# Patient Record
Sex: Female | Born: 1990 | Race: Black or African American | Hispanic: No | Marital: Single | Smoking: Never smoker
Health system: Southern US, Community
[De-identification: ages and names within clinical notes are randomized; demographics above are authoritative.]

## PROBLEM LIST (undated history)

## (undated) ENCOUNTER — Inpatient Hospital Stay (HOSPITAL_COMMUNITY): Payer: Self-pay

## (undated) DIAGNOSIS — A749 Chlamydial infection, unspecified: Secondary | ICD-10-CM

## (undated) DIAGNOSIS — Z789 Other specified health status: Secondary | ICD-10-CM

## (undated) DIAGNOSIS — I10 Essential (primary) hypertension: Secondary | ICD-10-CM

## (undated) DIAGNOSIS — N390 Urinary tract infection, site not specified: Secondary | ICD-10-CM

## (undated) DIAGNOSIS — K8020 Calculus of gallbladder without cholecystitis without obstruction: Secondary | ICD-10-CM

## (undated) DIAGNOSIS — O2340 Unspecified infection of urinary tract in pregnancy, unspecified trimester: Secondary | ICD-10-CM

## (undated) HISTORY — PX: INDUCED ABORTION: SHX677

## (undated) HISTORY — PX: EYE SURGERY: SHX253

## (undated) HISTORY — DX: Essential (primary) hypertension: I10

---
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
## 2009-03-29 ENCOUNTER — Emergency Department (HOSPITAL_COMMUNITY): Admission: EM | Admit: 2009-03-29 | Discharge: 2009-03-29 | Payer: Self-pay | Admitting: Emergency Medicine

## 2009-08-10 ENCOUNTER — Emergency Department (HOSPITAL_COMMUNITY): Admission: EM | Admit: 2009-08-10 | Discharge: 2009-08-10 | Payer: Self-pay | Admitting: Emergency Medicine

## 2009-11-30 ENCOUNTER — Emergency Department (HOSPITAL_COMMUNITY): Admission: EM | Admit: 2009-11-30 | Discharge: 2009-11-30 | Payer: Self-pay | Admitting: Pediatric Emergency Medicine

## 2009-12-03 ENCOUNTER — Emergency Department (HOSPITAL_COMMUNITY): Admission: EM | Admit: 2009-12-03 | Discharge: 2009-12-03 | Payer: Self-pay | Admitting: Emergency Medicine

## 2010-06-23 ENCOUNTER — Emergency Department (HOSPITAL_COMMUNITY)
Admission: EM | Admit: 2010-06-23 | Discharge: 2010-06-23 | Payer: Self-pay | Source: Home / Self Care | Admitting: Emergency Medicine

## 2010-09-17 LAB — URINALYSIS, ROUTINE W REFLEX MICROSCOPIC
Bilirubin Urine: NEGATIVE
Glucose, UA: NEGATIVE mg/dL
Hgb urine dipstick: NEGATIVE
Ketones, ur: NEGATIVE mg/dL
Nitrite: NEGATIVE
Protein, ur: NEGATIVE mg/dL
Specific Gravity, Urine: 1.03 (ref 1.005–1.030)
Urobilinogen, UA: 1 mg/dL (ref 0.0–1.0)
pH: 6 (ref 5.0–8.0)

## 2010-09-17 LAB — POCT I-STAT, CHEM 8
BUN: 14 mg/dL (ref 6–23)
Calcium, Ion: 1.22 mmol/L (ref 1.12–1.32)
Chloride: 106 mEq/L (ref 96–112)
Creatinine, Ser: 0.7 mg/dL (ref 0.4–1.2)
Glucose, Bld: 119 mg/dL — ABNORMAL HIGH (ref 70–99)
HCT: 40 % (ref 36.0–46.0)
Potassium: 3.9 mEq/L (ref 3.5–5.1)

## 2010-09-17 LAB — DIFFERENTIAL
Lymphocytes Relative: 22 % (ref 12–46)
Lymphs Abs: 3.1 10*3/uL (ref 0.7–4.0)
Monocytes Absolute: 1 10*3/uL (ref 0.1–1.0)
Monocytes Relative: 7 % (ref 3–12)
Neutro Abs: 9.8 10*3/uL — ABNORMAL HIGH (ref 1.7–7.7)
Neutrophils Relative %: 70 % (ref 43–77)

## 2010-09-17 LAB — POCT PREGNANCY, URINE: Preg Test, Ur: POSITIVE

## 2010-09-17 LAB — CBC
HCT: 38 % (ref 36.0–46.0)
Hemoglobin: 12.6 g/dL (ref 12.0–15.0)
MCHC: 33.2 g/dL (ref 30.0–36.0)
RBC: 4.37 MIL/uL (ref 3.87–5.11)
WBC: 14.1 10*3/uL — ABNORMAL HIGH (ref 4.0–10.5)

## 2010-09-24 LAB — CULTURE, ROUTINE-ABSCESS

## 2010-09-26 LAB — RAPID STREP SCREEN (MED CTR MEBANE ONLY): Streptococcus, Group A Screen (Direct): NEGATIVE

## 2010-10-11 ENCOUNTER — Inpatient Hospital Stay (HOSPITAL_COMMUNITY)
Admission: AD | Admit: 2010-10-11 | Discharge: 2010-10-11 | Disposition: A | Discharge status: Home / Self Care | Payer: Self-pay | Source: Ambulatory Visit | Attending: Obstetrics & Gynecology | Admitting: Obstetrics & Gynecology

## 2010-10-11 DIAGNOSIS — R350 Frequency of micturition: Secondary | ICD-10-CM | POA: Insufficient documentation

## 2010-10-11 DIAGNOSIS — N390 Urinary tract infection, site not specified: Secondary | ICD-10-CM | POA: Insufficient documentation

## 2010-10-11 DIAGNOSIS — O2390 Unspecified genitourinary tract infection in pregnancy, unspecified trimester: Secondary | ICD-10-CM | POA: Insufficient documentation

## 2010-10-11 LAB — URINALYSIS, ROUTINE W REFLEX MICROSCOPIC
Bilirubin Urine: NEGATIVE
Ketones, ur: NEGATIVE mg/dL
Nitrite: POSITIVE — AB
Protein, ur: NEGATIVE mg/dL
pH: 6 (ref 5.0–8.0)

## 2010-10-11 LAB — POCT PREGNANCY, URINE: Preg Test, Ur: NEGATIVE

## 2010-10-11 LAB — URINE MICROSCOPIC-ADD ON

## 2010-10-22 LAB — RAPID STREP SCREEN (MED CTR MEBANE ONLY): Streptococcus, Group A Screen (Direct): NEGATIVE

## 2010-11-15 ENCOUNTER — Inpatient Hospital Stay (HOSPITAL_COMMUNITY): Payer: Medicaid Other

## 2010-11-15 ENCOUNTER — Inpatient Hospital Stay (HOSPITAL_COMMUNITY)
Admission: AD | Admit: 2010-11-15 | Discharge: 2010-11-15 | Disposition: A | Discharge status: Home / Self Care | Payer: Medicaid Other | Source: Ambulatory Visit | Attending: Obstetrics and Gynecology | Admitting: Obstetrics and Gynecology

## 2010-11-15 DIAGNOSIS — O9989 Other specified diseases and conditions complicating pregnancy, childbirth and the puerperium: Secondary | ICD-10-CM

## 2010-11-15 DIAGNOSIS — R109 Unspecified abdominal pain: Secondary | ICD-10-CM

## 2010-11-15 DIAGNOSIS — O99891 Other specified diseases and conditions complicating pregnancy: Secondary | ICD-10-CM | POA: Insufficient documentation

## 2010-11-15 DIAGNOSIS — R52 Pain, unspecified: Secondary | ICD-10-CM

## 2010-11-15 LAB — URINALYSIS, ROUTINE W REFLEX MICROSCOPIC
Bilirubin Urine: NEGATIVE
Ketones, ur: NEGATIVE mg/dL
Nitrite: NEGATIVE
Specific Gravity, Urine: 1.025 (ref 1.005–1.030)
Urobilinogen, UA: 0.2 mg/dL (ref 0.0–1.0)

## 2010-11-15 LAB — POCT PREGNANCY, URINE: Preg Test, Ur: POSITIVE

## 2010-11-15 LAB — CBC
MCH: 28.3 pg (ref 26.0–34.0)
MCHC: 32 g/dL (ref 30.0–36.0)
Platelets: 308 10*3/uL (ref 150–400)
RDW: 13.3 % (ref 11.5–15.5)

## 2010-11-15 LAB — WET PREP, GENITAL
Trich, Wet Prep: NONE SEEN
Yeast Wet Prep HPF POC: NONE SEEN

## 2010-11-17 ENCOUNTER — Inpatient Hospital Stay (HOSPITAL_COMMUNITY)
Admission: AD | Admit: 2010-11-17 | Discharge: 2010-11-17 | Disposition: A | Discharge status: Home / Self Care | Payer: Medicaid Other | Source: Ambulatory Visit | Attending: Obstetrics & Gynecology | Admitting: Obstetrics & Gynecology

## 2010-11-17 DIAGNOSIS — R109 Unspecified abdominal pain: Secondary | ICD-10-CM | POA: Insufficient documentation

## 2010-11-17 DIAGNOSIS — O99891 Other specified diseases and conditions complicating pregnancy: Secondary | ICD-10-CM | POA: Insufficient documentation

## 2010-11-17 LAB — GC/CHLAMYDIA PROBE AMP, GENITAL: Chlamydia, DNA Probe: POSITIVE — AB

## 2010-11-17 LAB — HCG, QUANTITATIVE, PREGNANCY: hCG, Beta Chain, Quant, S: 3290 m[IU]/mL — ABNORMAL HIGH (ref <5)

## 2010-11-26 ENCOUNTER — Other Ambulatory Visit (HOSPITAL_COMMUNITY): Payer: Self-pay

## 2010-11-27 ENCOUNTER — Ambulatory Visit (HOSPITAL_COMMUNITY)
Admission: RE | Admit: 2010-11-27 | Discharge: 2010-11-27 | Disposition: A | Discharge status: Home / Self Care | Payer: Medicaid Other | Source: Ambulatory Visit | Attending: Obstetrics and Gynecology | Admitting: Obstetrics and Gynecology

## 2010-11-27 ENCOUNTER — Inpatient Hospital Stay (HOSPITAL_COMMUNITY)
Admission: EM | Admit: 2010-11-27 | Discharge: 2010-11-27 | Disposition: A | Discharge status: Home / Self Care | Payer: Medicaid Other | Source: Ambulatory Visit | Attending: Obstetrics & Gynecology | Admitting: Obstetrics & Gynecology

## 2010-11-27 DIAGNOSIS — Z3689 Encounter for other specified antenatal screening: Secondary | ICD-10-CM | POA: Insufficient documentation

## 2010-11-27 DIAGNOSIS — O99891 Other specified diseases and conditions complicating pregnancy: Secondary | ICD-10-CM | POA: Insufficient documentation

## 2011-01-20 ENCOUNTER — Encounter (HOSPITAL_COMMUNITY): Payer: Self-pay | Admitting: *Deleted

## 2011-01-20 ENCOUNTER — Inpatient Hospital Stay (HOSPITAL_COMMUNITY)
Admission: AD | Admit: 2011-01-20 | Discharge: 2011-01-20 | Disposition: A | Discharge status: Home / Self Care | Payer: Medicaid Other | Source: Ambulatory Visit | Attending: Obstetrics and Gynecology | Admitting: Obstetrics and Gynecology

## 2011-01-20 DIAGNOSIS — Z3490 Encounter for supervision of normal pregnancy, unspecified, unspecified trimester: Secondary | ICD-10-CM

## 2011-01-20 DIAGNOSIS — A499 Bacterial infection, unspecified: Secondary | ICD-10-CM

## 2011-01-20 DIAGNOSIS — B9689 Other specified bacterial agents as the cause of diseases classified elsewhere: Secondary | ICD-10-CM | POA: Insufficient documentation

## 2011-01-20 DIAGNOSIS — N760 Acute vaginitis: Secondary | ICD-10-CM

## 2011-01-20 DIAGNOSIS — O26859 Spotting complicating pregnancy, unspecified trimester: Secondary | ICD-10-CM

## 2011-01-20 DIAGNOSIS — O2390 Unspecified genitourinary tract infection in pregnancy, unspecified trimester: Secondary | ICD-10-CM | POA: Insufficient documentation

## 2011-01-20 HISTORY — DX: Other specified health status: Z78.9

## 2011-01-20 LAB — WET PREP, GENITAL: Yeast Wet Prep HPF POC: NONE SEEN

## 2011-01-20 MED ORDER — METRONIDAZOLE 500 MG PO TABS: 500.0000 mg | ORAL_TABLET | Freq: Two times a day (BID) | ORAL | Status: AC

## 2011-01-20 NOTE — ED Provider Notes (Signed)
History     Chief Complaint  Patient presents with  . Vaginal Bleeding   HPI G2P0010 @ 14.[redacted] wks EGA. Brown spotting started tonight, no pain, no vaginal discharge. No recent intercourse. No prenatal care yet, has applied for medicaid. Normal early u/s here at 6.[redacted] wks EGA.   OB History    Grav Para Term Preterm Abortions TAB SAB Ect Mult Living   2    1 1     0      Past Medical History  Diagnosis Date  . No pertinent past medical history     Past Surgical History  Procedure Date  . No past surgeries     Family History  Problem Relation Age of Onset  . Diabetes Mother   . Hypertension Mother   . Hypertension Father     History  Substance Use Topics  . Smoking status: Never Smoker   . Smokeless tobacco: Never Used  . Alcohol Use: No    Allergies: No Known Allergies  Prescriptions prior to admission  Medication Sig Dispense Refill  . prenatal vitamin w/FE, FA (PRENATAL 1 + 1) 27-1 MG TABS Take 1 tablet by mouth daily.          Review of Systems  Constitutional: Negative.   HENT: Negative.   Respiratory: Negative.   Cardiovascular: Negative.   Gastrointestinal: Negative.   Genitourinary:       + for vaginal bleeding  Musculoskeletal: Negative.   Neurological: Negative.   Psychiatric/Behavioral: Negative.    Physical Exam   Blood pressure 120/76, pulse 96, temperature 98.3 F (36.8 C), temperature source Oral, resp. rate 20, height 5\' 3"  (1.6 m), weight 80.922 kg (178 lb 6.4 oz), last menstrual period 10/12/2010.  Physical Exam  Nursing note and vitals reviewed. Constitutional: She is oriented to person, place, and time. She appears well-developed and well-nourished. No distress.  Cardiovascular: Normal rate.   Respiratory: Effort normal.  GI: Soft. Bowel sounds are normal.  Genitourinary: Uterus normal. There is no rash or lesion on the right labia. There is no rash or lesion on the left labia. Uterus is not tender. Enlarged: consistent with dates.  Cervix exhibits no motion tenderness and no friability. Discharge: scant pink d/c noted on swab. Right adnexum displays no mass, no tenderness and no fullness. Left adnexum displays no mass, no tenderness and no fullness. No tenderness or bleeding (scant amount of pinkish discharge noted on swab ) around the vagina. No vaginal discharge found.  Musculoskeletal: Normal range of motion.  Neurological: She is alert and oriented to person, place, and time.  Skin: Skin is warm and dry.  Psychiatric: She has a normal mood and affect.    MAU Course  Procedures - GC/CT collected  A/P: 20 yo G2P0 @ 14.[redacted] wks EGA with BV Rx flagyl Precautions rev'd Start prenatal care ASAP

## 2011-01-20 NOTE — Progress Notes (Signed)
Started brown spotting 2 hrs ago, also noted brown with wiping ago.  G2P0  TAB in Jan 12

## 2011-01-22 LAB — GC/CHLAMYDIA PROBE AMP, GENITAL
Chlamydia, DNA Probe: NEGATIVE
GC Probe Amp, Genital: NEGATIVE

## 2011-01-28 ENCOUNTER — Inpatient Hospital Stay (HOSPITAL_COMMUNITY)
Admission: AD | Admit: 2011-01-28 | Discharge: 2011-01-28 | Disposition: A | Discharge status: Home / Self Care | Payer: Medicaid Other | Source: Ambulatory Visit | Attending: Obstetrics & Gynecology | Admitting: Obstetrics & Gynecology

## 2011-01-28 DIAGNOSIS — B3731 Acute candidiasis of vulva and vagina: Secondary | ICD-10-CM | POA: Diagnosis present

## 2011-01-28 DIAGNOSIS — O2390 Unspecified genitourinary tract infection in pregnancy, unspecified trimester: Secondary | ICD-10-CM | POA: Insufficient documentation

## 2011-01-28 DIAGNOSIS — B373 Candidiasis of vulva and vagina: Secondary | ICD-10-CM

## 2011-01-28 LAB — WET PREP, GENITAL: Trich, Wet Prep: NONE SEEN

## 2011-01-28 MED ORDER — FLUCONAZOLE 150 MG PO TABS: 150.0000 mg | ORAL_TABLET | Freq: Once | ORAL | Status: AC

## 2011-01-28 NOTE — ED Provider Notes (Signed)
History     Chief Complaint  Patient presents with  . Vaginal Itching   HPI 20 y.o. G2P0010 @ [redacted]w[redacted]d presents c/o vaginal itching and discharge. Seen in MAU last week and treated for BV, odor has improved, now itching only. Has not started prenatal care yet, expecting medicaid card soon, will start once received.     OB History    Grav Para Term Preterm Abortions TAB SAB Ect Mult Living   2    1 1     0      Past Medical History  Diagnosis Date  . No pertinent past medical history     Past Surgical History  Procedure Date  . No past surgeries     Family History  Problem Relation Age of Onset  . Diabetes Mother   . Hypertension Mother   . Hypertension Father     History  Substance Use Topics  . Smoking status: Never Smoker   . Smokeless tobacco: Never Used  . Alcohol Use: No    Allergies: No Known Allergies  Prescriptions prior to admission  Medication Sig Dispense Refill  . metroNIDAZOLE (FLAGYL) 500 MG tablet Take 1 tablet (500 mg total) by mouth 2 (two) times daily.  14 tablet  0  . prenatal vitamin w/FE, FA (PRENATAL 1 + 1) 27-1 MG TABS Take 1 tablet by mouth daily.          Review of Systems  Constitutional: Negative.   Respiratory: Negative.   Cardiovascular: Negative.   Gastrointestinal: Negative for nausea, vomiting, abdominal pain, diarrhea and constipation.  Genitourinary: Negative for dysuria, urgency, frequency, hematuria and flank pain.       Negative for vaginal bleeding, cramping/contractions, Positive for vaginal discharge and itching  Musculoskeletal: Negative.   Neurological: Negative.   Psychiatric/Behavioral: Negative.    Physical Exam   Blood pressure 128/82, pulse 94, temperature 98.8 F (37.1 C), temperature source Oral, resp. rate 16, height 5\' 3"  (1.6 m), weight 80.831 kg (178 lb 3.2 oz), last menstrual period 10/12/2010.  Physical Exam  Nursing note and vitals reviewed. Constitutional: She is oriented to person, place, and  time. She appears well-developed and well-nourished. No distress.  HENT:  Head: Normocephalic and atraumatic.  Cardiovascular: Normal rate.   Respiratory: Effort normal.  GI: Soft. Bowel sounds are normal. She exhibits no mass. There is no tenderness. There is no rebound and no guarding.  Genitourinary: There is no rash or lesion on the right labia. There is no rash or lesion on the left labia. Uterus is not tender. Enlarged: Size c/w dates. Cervix exhibits no motion tenderness, no discharge and no friability. Right adnexum displays no mass, no tenderness and no fullness. Left adnexum displays no mass, no tenderness and no fullness. There is erythema and tenderness around the vagina. No bleeding around the vagina. Vaginal discharge (white, curd-like) found.  Musculoskeletal: Normal range of motion.  Neurological: She is alert and oriented to person, place, and time.  Skin: Skin is warm and dry.  Psychiatric: She has a normal mood and affect.    MAU Course  Procedures  Results for orders placed during the hospital encounter of 01/28/11 (from the past 24 hour(s))  WET PREP, GENITAL     Status: Abnormal   Collection Time   01/28/11 11:30 PM      Component Value Range   Yeast, Wet Prep RARE (*) NONE SEEN    Trich, Wet Prep NONE SEEN  NONE SEEN    Clue Cells, Wet  Prep NONE SEEN  NONE SEEN    WBC, Wet Prep HPF POC MODERATE (*) NONE SEEN      Assessment and Plan  20 yo G2P0010 at [redacted]w[redacted]d Candidal vaginitis - rx diflucan Start prenatal care ASAP  [facility] 01/28/2011, 11:46 PM

## 2011-01-28 NOTE — Progress Notes (Signed)
Pt seen in MAU 7/15 for BV, taking medication, now having vaginal itching and burning.  Pt G2 P0 at 15.3wks.

## 2011-02-27 LAB — GC/CHLAMYDIA PROBE AMP, GENITAL: Gonorrhea: NEGATIVE

## 2011-02-27 LAB — HIV ANTIBODY (ROUTINE TESTING W REFLEX): HIV: NONREACTIVE

## 2011-03-21 ENCOUNTER — Encounter (HOSPITAL_COMMUNITY): Payer: Self-pay | Admitting: *Deleted

## 2011-03-21 ENCOUNTER — Inpatient Hospital Stay (HOSPITAL_COMMUNITY)
Admission: AD | Admit: 2011-03-21 | Discharge: 2011-03-21 | Disposition: A | Discharge status: Home / Self Care | Payer: Medicaid Other | Source: Ambulatory Visit | Attending: Obstetrics and Gynecology | Admitting: Obstetrics and Gynecology

## 2011-03-21 DIAGNOSIS — B373 Candidiasis of vulva and vagina: Secondary | ICD-10-CM | POA: Insufficient documentation

## 2011-03-21 DIAGNOSIS — O2390 Unspecified genitourinary tract infection in pregnancy, unspecified trimester: Secondary | ICD-10-CM | POA: Insufficient documentation

## 2011-03-21 DIAGNOSIS — B3731 Acute candidiasis of vulva and vagina: Secondary | ICD-10-CM | POA: Insufficient documentation

## 2011-03-21 DIAGNOSIS — N949 Unspecified condition associated with female genital organs and menstrual cycle: Secondary | ICD-10-CM | POA: Insufficient documentation

## 2011-03-21 LAB — URINALYSIS, ROUTINE W REFLEX MICROSCOPIC
Bilirubin Urine: NEGATIVE
Glucose, UA: NEGATIVE mg/dL
Ketones, ur: 15 mg/dL — AB
Nitrite: NEGATIVE
Protein, ur: NEGATIVE mg/dL
pH: 6 (ref 5.0–8.0)

## 2011-03-21 LAB — URINE MICROSCOPIC-ADD ON

## 2011-03-21 LAB — WET PREP, GENITAL

## 2011-03-21 MED ORDER — FLUCONAZOLE 150 MG PO TABS: 150.0000 mg | ORAL_TABLET | Freq: Once | ORAL | Status: AC

## 2011-03-21 NOTE — ED Provider Notes (Signed)
History     Chief Complaint  Patient presents with  . Vaginal Discharge   HPI Itching, burning, discharge x 2 or 3 days. + Fetal movement, no bleeding or pain. Has been treated for BV and yeast in this pregnancy.   OB History    Grav Para Term Preterm Abortions TAB SAB Ect Mult Living   2    1 1     0      Past Medical History  Diagnosis Date  . No pertinent past medical history     Past Surgical History  Procedure Date  . No past surgeries   . 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     Family History  Problem Relation Age of Onset  . Diabetes Mother   . Hypertension Mother   . Hypertension Father     History  Substance Use Topics  . Smoking status: Never Smoker   . Smokeless tobacco: Never Used  . Alcohol Use: No    Allergies: No Known Allergies  No prescriptions prior to admission    Review of Systems  Constitutional: Negative.   Respiratory: Negative.   Cardiovascular: Negative.   Gastrointestinal: Negative for nausea, vomiting, abdominal pain, diarrhea and constipation.  Genitourinary: Negative for dysuria, urgency, frequency, hematuria and flank pain.       Negative for vaginal bleeding, cramping/contractions  Musculoskeletal: Negative.   Neurological: Negative.   Psychiatric/Behavioral: Negative.    Physical Exam   Blood pressure 117/64, pulse 87, temperature 98.1 F (36.7 C), temperature source Oral, resp. rate 18, height 5\' 3"  (1.6 m), weight 81.738 kg (180 lb 3.2 oz), last menstrual period 10/12/2010.  Physical Exam  Nursing note and vitals reviewed. Constitutional: She is oriented to person, place, and time. She appears well-developed and well-nourished. No distress.  HENT:  Head: Normocephalic and atraumatic.  Cardiovascular: Normal rate.   Respiratory: Effort normal.  GI: Soft. Bowel sounds are normal. She exhibits no mass. There is no tenderness. There is no rebound and no guarding.  Genitourinary: There is no rash or lesion on the  right labia. There is no rash or lesion on the left labia. Uterus is not tender. Enlarged: Size c/w dates. Cervix exhibits no motion tenderness, no discharge and no friability. Right adnexum displays no mass, no tenderness and no fullness. Left adnexum displays no mass, no tenderness and no fullness. No tenderness or bleeding around the vagina. Vaginal discharge (copius creamy and curdlike white vaginal discharge) found.  Musculoskeletal: Normal range of motion.  Neurological: She is alert and oriented to person, place, and time.  Skin: Skin is warm and dry.  Psychiatric: She has a normal mood and affect.    MAU Course  Procedures Results for orders placed during the hospital encounter of 03/21/11 (from the past 24 hour(s))  URINALYSIS, ROUTINE W REFLEX MICROSCOPIC     Status: Abnormal   Collection Time   03/21/11  7:50 PM      Component Value Range   Color, Urine YELLOW  YELLOW    Appearance CLOUDY (*) CLEAR    Specific Gravity, Urine >1.030 (*) 1.005 - 1.030    pH 6.0  5.0 - 8.0    Glucose, UA NEGATIVE  NEGATIVE (mg/dL)   Hgb urine dipstick NEGATIVE  NEGATIVE    Bilirubin Urine NEGATIVE  NEGATIVE    Ketones, ur 15 (*) NEGATIVE (mg/dL)   Protein, ur NEGATIVE  NEGATIVE (mg/dL)   Urobilinogen, UA 0.2  0.0 - 1.0 (mg/dL)  Nitrite NEGATIVE  NEGATIVE    Leukocytes, UA SMALL (*) NEGATIVE   URINE MICROSCOPIC-ADD ON     Status: Abnormal   Collection Time   03/21/11  7:50 PM      Component Value Range   Squamous Epithelial / LPF MANY (*) RARE    WBC, UA 11-20  <3 (WBC/hpf)   Bacteria, UA FEW (*) RARE    Crystals CA OXALATE CRYSTALS (*) NEGATIVE    Urine-Other MUCOUS PRESENT    WET PREP, GENITAL     Status: Abnormal   Collection Time   03/21/11  9:50 PM      Component Value Range   Yeast, Wet Prep FEW (*) NONE SEEN    Trich, Wet Prep NONE SEEN  NONE SEEN    Clue Cells, Wet Prep NONE SEEN  NONE SEEN    WBC, Wet Prep HPF POC MANY (*) NONE SEEN      Assessment and Plan  Candidal  vaginitis - rx diflucan  Urine sent for culture Dr. Senaida Ores agrees with plan  Julia Mayer 03/21/2011, 11:30 PM

## 2011-03-21 NOTE — Progress Notes (Signed)
Pt presents to mau for itching and burning in vaginal area.  Has had yeast infection before and states it feels the same.

## 2011-03-21 NOTE — Progress Notes (Signed)
SSE done per N. Frazier, CNM.  Wet prep and cultures collected.  

## 2011-03-21 NOTE — Progress Notes (Signed)
Pt G2 P0 at 22.6wks, reports vaginal discharge and irritation.  Denies bleeding or pain.  No problems with pregnancy.

## 2011-03-21 NOTE — Progress Notes (Signed)
N. Frazier, CNM at bedside.  Assessment done and poc discussed with pt.  

## 2011-03-22 LAB — GC/CHLAMYDIA PROBE AMP, GENITAL
Chlamydia, DNA Probe: NEGATIVE
GC Probe Amp, Genital: NEGATIVE

## 2011-03-23 LAB — URINE CULTURE
Colony Count: NO GROWTH
Culture  Setup Time: 201209140607

## 2011-04-17 LAB — URINALYSIS, ROUTINE W REFLEX MICROSCOPIC
Ketones, ur: 15 — AB
Nitrite: NEGATIVE
Protein, ur: NEGATIVE
Urobilinogen, UA: 0.2

## 2011-04-17 LAB — POCT PREGNANCY, URINE: Operator id: 27011

## 2011-04-22 ENCOUNTER — Encounter (HOSPITAL_COMMUNITY): Payer: Self-pay | Admitting: *Deleted

## 2011-04-22 ENCOUNTER — Inpatient Hospital Stay (HOSPITAL_COMMUNITY)
Admission: AD | Admit: 2011-04-22 | Discharge: 2011-04-22 | Disposition: A | Discharge status: Home / Self Care | Payer: Medicaid Other | Source: Ambulatory Visit | Attending: Obstetrics and Gynecology | Admitting: Obstetrics and Gynecology

## 2011-04-22 DIAGNOSIS — O99891 Other specified diseases and conditions complicating pregnancy: Secondary | ICD-10-CM | POA: Insufficient documentation

## 2011-04-22 DIAGNOSIS — R109 Unspecified abdominal pain: Secondary | ICD-10-CM | POA: Insufficient documentation

## 2011-04-22 DIAGNOSIS — O4700 False labor before 37 completed weeks of gestation, unspecified trimester: Secondary | ICD-10-CM

## 2011-04-22 HISTORY — DX: Chlamydial infection, unspecified: A74.9

## 2011-04-22 LAB — URINALYSIS, ROUTINE W REFLEX MICROSCOPIC
Ketones, ur: NEGATIVE mg/dL
Leukocytes, UA: NEGATIVE
Nitrite: NEGATIVE
Specific Gravity, Urine: 1.015 (ref 1.005–1.030)
pH: 6.5 (ref 5.0–8.0)

## 2011-04-22 MED ORDER — TERBUTALINE SULFATE 1 MG/ML IJ SOLN
0.2500 mg | Freq: Once | INTRAMUSCULAR | Status: AC
  Administered 2011-04-22: 0.25 mg via SUBCUTANEOUS
  Filled 2011-04-22: qty 1

## 2011-04-22 NOTE — Progress Notes (Signed)
Informed pt of negative FFN.  Will prepare to send pt home.

## 2011-04-22 NOTE — ED Provider Notes (Signed)
History     Chief Complaint  Patient presents with  . Abdominal Pain   HPI 20 yo G2 P0010 at 27.2weeks presents with c/o intermittent pain in lower abdomen. Some pains localize to the RLQ. No bleeding or fever. Good fetal movement.    Past Medical History  Diagnosis Date  . No pertinent past medical history   . Chlamydia     Past Surgical History  Procedure Date  . No past surgeries   . 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   . Induced abortion     Family History  Problem Relation Age of Onset  . Hypertension Mother   . Asthma Mother   . Alcohol abuse Mother   . Hypertension Father   . Hyperlipidemia Father   . Cancer Maternal Grandmother   . Cancer Paternal Grandmother     History  Substance Use Topics  . Smoking status: Never Smoker   . Smokeless tobacco: Never Used  . Alcohol Use: No     occasionally but not during pregnancy    Allergies: No Known Allergies  Prescriptions prior to admission  Medication Sig Dispense Refill  . prenatal vitamin w/FE, FA (PRENATAL 1 + 1) 27-1 MG TABS Take 1 tablet by mouth daily.          ROS  As listed above.  Physical Exam   Blood pressure 113/68, pulse 89, temperature 98.2 F (36.8 C), temperature source Oral, resp. rate 18, height 5\' 3"  (1.6 m), weight 184 lb (83.462 kg), last menstrual period 10/12/2010, SpO2 99.00%.  Physical Exam  Constitutional: She is oriented to person, place, and time. She appears well-developed and well-nourished. No distress.  HENT:  Head: Normocephalic.  Cardiovascular: Normal rate.   Respiratory: Effort normal.  GI: Soft. She exhibits no distension. There is no tenderness. There is no rebound and no guarding.  Genitourinary: Vagina normal and uterus normal.       FHR reassuring. Uterine irritability with contractions lasting 20-30 seconds each every 4-5 minutes. Cervix 1cm external os, closed internally, long, high.  Musculoskeletal: Normal range of motion.  Neurological: She is  alert and oriented to person, place, and time.  Skin: Skin is warm and dry.  Psychiatric: She has a normal mood and affect.    MAU Course  Procedures   Assessment and Plan  Consulted Dr Ambrose Mantle. Will give Terbutaline and send FFN If FFN negative, d/c home.   Denim Kalmbach 04/22/2011, 1:38 AM   FFN negative and irritability diminished, Will d/c home.

## 2011-04-29 LAB — RPR: RPR: NONREACTIVE

## 2011-05-24 LAB — ABO/RH

## 2011-05-24 LAB — HEPATITIS B SURFACE ANTIGEN: Hepatitis B Surface Ag: NEGATIVE

## 2011-05-24 LAB — RUBELLA ANTIBODY, IGM: Rubella: IMMUNE

## 2011-05-24 LAB — HIV ANTIBODY (ROUTINE TESTING W REFLEX): HIV: NONREACTIVE

## 2011-05-24 LAB — STREP B DNA PROBE: GBS: NEGATIVE

## 2011-06-03 ENCOUNTER — Encounter (HOSPITAL_COMMUNITY): Payer: Self-pay | Admitting: *Deleted

## 2011-06-03 ENCOUNTER — Emergency Department (HOSPITAL_COMMUNITY)
Admission: EM | Admit: 2011-06-03 | Discharge: 2011-06-03 | Disposition: A | Discharge status: Home / Self Care | Payer: Medicaid Other | Attending: Emergency Medicine | Admitting: Emergency Medicine

## 2011-06-03 DIAGNOSIS — B9789 Other viral agents as the cause of diseases classified elsewhere: Secondary | ICD-10-CM | POA: Insufficient documentation

## 2011-06-03 DIAGNOSIS — R5383 Other fatigue: Secondary | ICD-10-CM | POA: Insufficient documentation

## 2011-06-03 DIAGNOSIS — O2390 Unspecified genitourinary tract infection in pregnancy, unspecified trimester: Secondary | ICD-10-CM | POA: Insufficient documentation

## 2011-06-03 DIAGNOSIS — R51 Headache: Secondary | ICD-10-CM | POA: Insufficient documentation

## 2011-06-03 DIAGNOSIS — N390 Urinary tract infection, site not specified: Secondary | ICD-10-CM | POA: Insufficient documentation

## 2011-06-03 DIAGNOSIS — R5381 Other malaise: Secondary | ICD-10-CM | POA: Insufficient documentation

## 2011-06-03 DIAGNOSIS — B349 Viral infection, unspecified: Secondary | ICD-10-CM

## 2011-06-03 LAB — URINE MICROSCOPIC-ADD ON

## 2011-06-03 LAB — URINALYSIS, ROUTINE W REFLEX MICROSCOPIC
Hgb urine dipstick: NEGATIVE
Protein, ur: 30 mg/dL — AB
Urobilinogen, UA: 0.2 mg/dL (ref 0.0–1.0)

## 2011-06-03 LAB — GLUCOSE, CAPILLARY: Glucose-Capillary: 106 mg/dL — ABNORMAL HIGH (ref 70–99)

## 2011-06-03 MED ORDER — NITROFURANTOIN MONOHYD MACRO 100 MG PO CAPS: 100.0000 mg | ORAL_CAPSULE | Freq: Two times a day (BID) | ORAL | Status: DC

## 2011-06-03 NOTE — ED Notes (Signed)
Called to see patient with initial triage c/o headache. On evaluation by ED RN pt did not c/o headache, co of flu-like symptoms, and intermittent tightening to back and that she is [redacted] weeks pregnant.. Pt and family state that they called [facility] around 2:30p and were told by office to go to urgent care or emergency room.  Pt placed on suvelliance monitor, given ginger ale and peanut butter and crackers. Call to Dr Senaida Ores. Update given. Will check pts cervix after reactive tracing. Owens Shark [facility]

## 2011-06-03 NOTE — ED Provider Notes (Signed)
History     CSN: [id number] Arrival date & time: 06/03/2011  4:33 PM   First MD Initiated Contact with Patient 06/03/11 2012      Chief Complaint  Patient presents with  . Headache    (Consider location/radiation/quality/duration/timing/severity/associated sxs/prior treatment) The history is provided by the patient.  Pt is [redacted]wks pregnant and presents with generalized malaise which started yesterday. This has been accompanied by URI sx (runny nose, cough, headache). She has had generalized achiness, which seems to be worst in the low back. Denies urinary sx, fever. She has not had any contractions, vaginal discharge or leakage of fluid, or bleeding. She is followed by [facility]; she called their office this afternoon and was instructed to come here for further eval/mgmt.  Past Medical History  Diagnosis Date  . No pertinent past medical history   . Chlamydia     Past Surgical History  Procedure Date  . No past surgeries   . 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   . Induced abortion     Family History  Problem Relation Age of Onset  . Hypertension Mother   . Asthma Mother   . Alcohol abuse Mother   . Hypertension Father   . Hyperlipidemia Father   . Cancer Maternal Grandmother   . Cancer Paternal Grandmother     History  Substance Use Topics  . Smoking status: Never Smoker   . Smokeless tobacco: Never Used  . Alcohol Use: No     occasionally but not during pregnancy    OB History    Grav Para Term Preterm Abortions TAB SAB Ect Mult Living   2    1 1     0      Review of Systems  Constitutional: Negative for fever, chills, diaphoresis, activity change, appetite change and fatigue.  HENT: Negative for ear pain, congestion, rhinorrhea, neck pain, neck stiffness and ear discharge.   Eyes: Negative for photophobia, discharge and visual disturbance.  Respiratory: Positive for cough. Negative for shortness of breath.   Cardiovascular: Negative for chest  pain and palpitations.  Gastrointestinal: Negative for nausea, vomiting, abdominal pain and diarrhea.  Genitourinary: Negative for dysuria, vaginal bleeding, vaginal discharge and vaginal pain.  Musculoskeletal: Positive for myalgias.  Skin: Negative for color change and wound.  Neurological: Positive for headaches. Negative for dizziness and weakness.    Allergies  Review of patient's allergies indicates no known allergies.  Home Medications   Current Outpatient Rx  Name Route Sig Dispense Refill  . PRENATAL PLUS 27-1 MG PO TABS Oral Take 1 tablet by mouth daily.        BP 129/71  Pulse 96  Temp(Src) 98.8 F (37.1 C) (Oral)  Resp 18  SpO2 100%  LMP 10/12/2010  Physical Exam  Nursing note and vitals reviewed. Constitutional: She is oriented to person, place, and time. She appears well-developed and well-nourished. No distress.  HENT:  Head: Normocephalic and atraumatic.  Right Ear: External ear normal.  Left Ear: External ear normal.  Nose: Nose normal.  Mouth/Throat: Oropharynx is clear and moist. No oropharyngeal exudate.       TMs normal bilaterally. No oropharyngeal exudate. No sinus tenderness.  Eyes: Conjunctivae and EOM are normal. Pupils are equal, round, and reactive to light.  Neck: Normal range of motion. Neck supple.  Cardiovascular: Normal rate, regular rhythm and normal heart sounds.   Pulmonary/Chest: Effort normal and breath sounds normal. She has no wheezes. She exhibits  no tenderness.  Abdominal: Soft. Bowel sounds are normal. There is no tenderness.       Gravid, b/l CVA tenderness  Musculoskeletal: Normal range of motion.  Lymphadenopathy:    She has no cervical adenopathy.  Neurological: She is alert and oriented to person, place, and time. No cranial nerve deficit.  Skin: Skin is warm and dry. No rash noted. She is not diaphoretic.    ED Course  Procedures (including critical care time)  Labs Reviewed  URINALYSIS, ROUTINE W REFLEX MICROSCOPIC  - Abnormal; Notable for the following:    Ketones, ur 40 (*)    Protein, ur 30 (*)    Leukocytes, UA TRACE (*)    All other components within normal limits  GLUCOSE, CAPILLARY - Abnormal; Notable for the following:    Glucose-Capillary 106 (*)    All other components within normal limits  URINE MICROSCOPIC-ADD ON - Abnormal; Notable for the following:    Squamous Epithelial / LPF MANY (*)    Bacteria, UA MANY (*)    All other components within normal limits  URINE CULTURE   No results found.   1. Urinary tract infection   2. Viral illness       MDM  9:56 PM Pt seen and evaluated. She is [redacted] wks pregnant; has felt achy all over for the past day, seems worst in lower back. +CVA tenderness b/l. No acute distress. VSS. Denies vaginal d/c, bleeding, fluid. OB Rapid Response RN called to eval pt.  10:41 PM Discussed with Owens Shark, OB RN. She has discussed case with Dr. Senaida Ores (OB). She is obtaining reactive tracing - FHTs appear reassuring on monitor in 150s. She will check pt's cervix after tracing.   I suspect her generalized malaise/URI sx are most likely a viral process. Provided the tracing looks ok and the cervix appears closed, will plan to discharge home. She has a very questionable UTI - will plan to treat.    OB RN states cervix is closed, fetal tracing reactive. Will d/c home with rx for Macrobid; urine sent for cx. Pt was instructed to make f/u with OB this week, and to [facility] if [facility]x [facility]. She verbalized understanding and agreed to plan.   Grant Fontana, [place] 06/04/11 [phone]

## 2011-06-03 NOTE — ED Notes (Signed)
Rapid response RN is here and is placing pt on fetal heart monitor.

## 2011-06-03 NOTE — ED Notes (Signed)
Cervix c//th/hi. FHR tracing reactive.D/c from [facility] care during this ED stay per Dr Senaida Ores. Pt encouraged to increase fluids. Owens Shark [facility]

## 2011-06-03 NOTE — ED Notes (Signed)
Pt has come to the nurse station saying she is in pain.  She has just been brought to the [place], ambulatory.  She says she has been waiting for nearly 6 hrs and is [redacted] wks pregnant.   Rapid OB RN has been called and will look for doppler to get FHT's.

## 2011-06-03 NOTE — ED Notes (Signed)
Pt states she has been having "flu like sx's" since last night.  States she has had a sore throat, ear aches, h/a, and bilat low back pain that runs down into her butt.  States she thinks they are braxton hix contractions.  She says she [facility] OB [facility] [place] and says they told her to be seen here.  She says she has been waiting in waiting room for 6 hrs and has had something to eat/drink about 1 hr ago.

## 2011-06-03 NOTE — ED Notes (Signed)
General malaise

## 2011-06-04 NOTE — ED Provider Notes (Signed)
Medical screening examination/treatment/procedure(s) were performed by non-physician practitioner and as supervising physician I was immediately available for consultation/collaboration.  Doug Sou, MD 06/04/11 [phone]

## 2011-06-07 LAB — URINE CULTURE

## 2011-06-08 NOTE — ED Notes (Addendum)
+   urine culture. Resistant to prescribed Macrobid, Chart sent to EDP office for review Give Keflex 500 mg po BID x 7 days.Patient must follow up this week with obstetrician per Trixie Dredge PA. Unable to contact via phone Letter sent.

## 2011-06-12 ENCOUNTER — Encounter (HOSPITAL_COMMUNITY): Payer: Self-pay | Admitting: *Deleted

## 2011-06-12 ENCOUNTER — Inpatient Hospital Stay (HOSPITAL_COMMUNITY)
Admission: AD | Admit: 2011-06-12 | Discharge: 2011-06-12 | Disposition: A | Discharge status: Home / Self Care | Payer: Medicaid Other | Source: Ambulatory Visit | Attending: Obstetrics and Gynecology | Admitting: Obstetrics and Gynecology

## 2011-06-12 DIAGNOSIS — O4700 False labor before 37 completed weeks of gestation, unspecified trimester: Secondary | ICD-10-CM

## 2011-06-12 DIAGNOSIS — N390 Urinary tract infection, site not specified: Secondary | ICD-10-CM | POA: Insufficient documentation

## 2011-06-12 DIAGNOSIS — O2390 Unspecified genitourinary tract infection in pregnancy, unspecified trimester: Secondary | ICD-10-CM | POA: Insufficient documentation

## 2011-06-12 HISTORY — DX: Urinary tract infection, site not specified: N39.0

## 2011-06-12 LAB — URINALYSIS, ROUTINE W REFLEX MICROSCOPIC
Bilirubin Urine: NEGATIVE
Glucose, UA: NEGATIVE mg/dL
Hgb urine dipstick: NEGATIVE
Specific Gravity, Urine: 1.01 (ref 1.005–1.030)
pH: 6.5 (ref 5.0–8.0)

## 2011-06-12 LAB — URINE MICROSCOPIC-ADD ON

## 2011-06-12 MED ORDER — TERBUTALINE SULFATE 1 MG/ML IJ SOLN
0.2500 mg | Freq: Once | INTRAMUSCULAR | Status: AC
  Administered 2011-06-12: 0.25 mg via SUBCUTANEOUS
  Filled 2011-06-12: qty 1

## 2011-06-12 MED ORDER — CEPHALEXIN 500 MG PO CAPS: 500.0000 mg | ORAL_CAPSULE | Freq: Three times a day (TID) | ORAL | Status: AC

## 2011-06-12 NOTE — Progress Notes (Signed)
Patient having contractions for the last hour. Patient states they are close but does not know how far apart

## 2011-06-12 NOTE — Progress Notes (Signed)
FHT from earlier this am reviewed.  Reactive NST with occasional variable decel, irreg ctx that spaced out after terbutaline.

## 2011-06-12 NOTE — ED Provider Notes (Signed)
History     Chief Complaint  Patient presents with  . Contractions   HPI 20 y.o. G2P0010 at [redacted]w[redacted]d with contractions x 1 hour, unsure how far apart, no bleeding or LOF. Uncomplicated prenatal course. Seen at [facility] last week and treated for UTI, currently taking Macrobid.    Past Medical History  Diagnosis Date  . No pertinent past medical history   . Chlamydia   . UTI (urinary tract infection)     Past Surgical History  Procedure Date  . No past surgeries   . 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   . Induced abortion     Family History  Problem Relation Age of Onset  . Hypertension Mother   . Asthma Mother   . Alcohol abuse Mother   . Hypertension Father   . Hyperlipidemia Father   . Cancer Maternal Grandmother   . Cancer Paternal Grandmother     History  Substance Use Topics  . Smoking status: Never Smoker   . Smokeless tobacco: Never Used  . Alcohol Use: No     occasionally but not during pregnancy    Allergies: No Known Allergies  Prescriptions prior to admission  Medication Sig Dispense Refill  . nitrofurantoin, macrocrystal-monohydrate, (MACROBID) 100 MG capsule Take 1 capsule (100 mg total) by mouth 2 (two) times daily.  14 capsule  0  . prenatal vitamin w/FE, FA (PRENATAL 1 + 1) 27-1 MG TABS Take 1 tablet by mouth daily.          Review of Systems  Constitutional: Negative.   Respiratory: Negative.   Cardiovascular: Negative.   Gastrointestinal: Negative for nausea, vomiting, abdominal pain, diarrhea and constipation.  Genitourinary: Negative for dysuria, urgency, frequency, hematuria and flank pain.       Negative for vaginal bleeding, Positive for contractions  Musculoskeletal: Negative.   Neurological: Negative.   Psychiatric/Behavioral: Negative.    Physical Exam   Blood pressure 119/66, pulse 79, temperature 98.2 F (36.8 C), resp. rate 20, height 5\' 2"  (1.575 m), weight 85.276 kg (188 lb), last menstrual period 10/12/2010, SpO2  98.00%.  Physical Exam  Nursing note and vitals reviewed. Constitutional: She is oriented to person, place, and time. She appears well-developed and well-nourished. No distress.  HENT:  Head: Normocephalic and atraumatic.  Cardiovascular: Normal rate.   Respiratory: Effort normal.  GI: Soft. Bowel sounds are normal. She exhibits no mass. There is no tenderness. There is no rebound and no guarding.  Genitourinary: There is no rash or lesion on the right labia. There is no rash or lesion on the left labia. Enlarged: Size c/w dates. No tenderness or bleeding around the vagina. Vaginal discharge (mucous) found.       SVE: FT/thick/high/posterior  Musculoskeletal: Normal range of motion.  Neurological: She is alert and oriented to person, place, and time.  Skin: Skin is warm and dry.  Psychiatric: She has a normal mood and affect.    MAU Course  Procedures  Per Dr. Jackelyn Knife, terbutaline 0.25 SQ given for contractions, if contractions decrease and no signs of active labor, will d/c home  Contractions decreased following terbutaline.   Results for orders placed during the hospital encounter of 06/12/11 (from the past 24 hour(s))  URINALYSIS, ROUTINE W REFLEX MICROSCOPIC     Status: Abnormal   Collection Time   06/12/11  3:00 AM      Component Value Range   Color, Urine YELLOW  YELLOW    APPearance HAZY (*)  CLEAR    Specific Gravity, Urine 1.010  1.005 - 1.030    pH 6.5  5.0 - 8.0    Glucose, UA NEGATIVE  NEGATIVE (mg/dL)   Hgb urine dipstick NEGATIVE  NEGATIVE    Bilirubin Urine NEGATIVE  NEGATIVE    Ketones, ur 15 (*) NEGATIVE (mg/dL)   Protein, ur NEGATIVE  NEGATIVE (mg/dL)   Urobilinogen, UA 0.2  0.0 - 1.0 (mg/dL)   Nitrite NEGATIVE  NEGATIVE    Leukocytes, UA LARGE (*) NEGATIVE   URINE MICROSCOPIC-ADD ON     Status: Abnormal   Collection Time   06/12/11  3:00 AM      Component Value Range   Squamous Epithelial / LPF FEW (*) RARE    WBC, UA 7-10  <3 (WBC/hpf)   Bacteria, UA  FEW (*) RARE     Rev'd urine culture from [facility] - resistant to Macrobid, will give new rx   Assessment and Plan  20 y.o. G2P0010 at [redacted]w[redacted]d Threatened preterm labor - stable following terbuatline UTI - resistant to Macrobid, rx Keflex F/U as scheduled or sooner PRN   Skyelynn Rambeau 06/12/2011, 3:05 AM

## 2011-06-15 ENCOUNTER — Inpatient Hospital Stay (HOSPITAL_COMMUNITY)
Admission: AD | Admit: 2011-06-15 | Discharge: 2011-06-15 | Disposition: A | Discharge status: Home / Self Care | Payer: Medicaid Other | Source: Ambulatory Visit | Attending: Obstetrics and Gynecology | Admitting: Obstetrics and Gynecology

## 2011-06-15 ENCOUNTER — Encounter (HOSPITAL_COMMUNITY): Payer: Self-pay | Admitting: *Deleted

## 2011-06-15 DIAGNOSIS — O4700 False labor before 37 completed weeks of gestation, unspecified trimester: Secondary | ICD-10-CM | POA: Insufficient documentation

## 2011-06-15 DIAGNOSIS — O26899 Other specified pregnancy related conditions, unspecified trimester: Secondary | ICD-10-CM

## 2011-06-15 DIAGNOSIS — O2340 Unspecified infection of urinary tract in pregnancy, unspecified trimester: Secondary | ICD-10-CM

## 2011-06-15 DIAGNOSIS — R109 Unspecified abdominal pain: Secondary | ICD-10-CM

## 2011-06-15 DIAGNOSIS — R10813 Right lower quadrant abdominal tenderness: Secondary | ICD-10-CM

## 2011-06-15 HISTORY — DX: Unspecified infection of urinary tract in pregnancy, unspecified trimester: O23.40

## 2011-06-15 LAB — URINALYSIS, ROUTINE W REFLEX MICROSCOPIC
Bilirubin Urine: NEGATIVE
Leukocytes, UA: NEGATIVE
Nitrite: NEGATIVE
Specific Gravity, Urine: 1.01 (ref 1.005–1.030)
Urobilinogen, UA: 0.2 mg/dL (ref 0.0–1.0)
pH: 5.5 (ref 5.0–8.0)

## 2011-06-15 MED ORDER — ACETAMINOPHEN 325 MG PO TABS: 650.0000 mg | ORAL_TABLET | Freq: Once | ORAL | Status: DC

## 2011-06-15 MED ORDER — ACETAMINOPHEN 325 MG PO TABS
650.0000 mg | ORAL_TABLET | Freq: Once | ORAL | Status: AC
  Administered 2011-06-15: 650 mg via ORAL
  Filled 2011-06-15: qty 2

## 2011-06-15 NOTE — ED Notes (Signed)
S.Lineberry NP in to see pt and discuss d/c plan

## 2011-06-15 NOTE — Progress Notes (Signed)
EFM d/ced 

## 2011-06-15 NOTE — Progress Notes (Signed)
Written and verbal d/c instructions given and understanding voiced. 

## 2011-06-15 NOTE — ED Notes (Signed)
Wende Bushy NP in to see pt.

## 2011-06-15 NOTE — Progress Notes (Signed)
Pt states, " I 've been having contractions off and on since Thursday. Tonight they have been 3-5 min apart. I was diagnosed with a UTI earlier this week."

## 2011-06-15 NOTE — Progress Notes (Signed)
Baby active and difficult to monitor

## 2011-06-16 NOTE — Progress Notes (Signed)
FHT from 12-8 reviewed.  Reactive NST, irreg ctx. 

## 2011-07-04 ENCOUNTER — Encounter (HOSPITAL_COMMUNITY): Payer: Self-pay | Admitting: *Deleted

## 2011-07-04 ENCOUNTER — Inpatient Hospital Stay (HOSPITAL_COMMUNITY)
Admission: AD | Admit: 2011-07-04 | Discharge: 2011-07-05 | Disposition: A | Discharge status: Home / Self Care | Payer: Medicaid Other | Source: Ambulatory Visit | Attending: Obstetrics and Gynecology | Admitting: Obstetrics and Gynecology

## 2011-07-04 DIAGNOSIS — O479 False labor, unspecified: Secondary | ICD-10-CM | POA: Insufficient documentation

## 2011-07-04 NOTE — Progress Notes (Signed)
Pt states she started feeling pain about 0900 this morning. Pt states she has not been timing contractions

## 2011-07-04 NOTE — Progress Notes (Signed)
Pt G2 P0 at 37.6wks having contractions all day.  Passed mucous plug earlier today.  Denies bleeding or leaking.

## 2011-07-05 NOTE — ED Notes (Signed)
No response after 30 days chart appended and sent to Medical records.

## 2011-07-09 NOTE — L&D Delivery Note (Signed)
Delivery Note At 2:29 AM a viable female was delivered via Vaginal, Spontaneous Delivery (Presentation: ; Occiput Anterior).  APGAR: 9, 9; weight 6 lb 14 oz (3118 g).   Placenta status: Intact, Spontaneous.  Cord: 3 vessels with the following complications: None.    Anesthesia: Epidural  Episiotomy: None Lacerations: 2nd degree;Perineal Suture Repair: 3.0 vicryl rapide Est. Blood Loss (mL): 500  Mom to postpartum.  Baby to nursery-stable.  BOVARD,Klaryssa Fauth 07/20/2011, 2:54 AM  Br/Bo?/ A+/ RI/ Depo

## 2011-07-18 ENCOUNTER — Other Ambulatory Visit: Payer: Self-pay | Admitting: Obstetrics and Gynecology

## 2011-07-18 NOTE — H&P (Signed)
  21 yo G2 P0010 @ 40+ for iol, given term status and favorable cervix.  Uncomplicated PNC except late presentation, around 19 weeks.  +FM, no LOF, no VB, occ ctx; also c/o sciatic pain.  PMH none PSH Eye sx  D&E POBGYNHx G2P0010 G1 TAB G2 present No abnormal pap H/o Chl  Meds PNV All NKDA SH denies tob, ETOH, and drugs, single FH Breast CA, DM, Liver CA  ROS negative PE AFVSS gen NAD CV RRR Lungs CTAB Abd soft, FNT Ext sym, NT  Fundus appropriate for gestation EFW 7-8#  PNL A+, Ab Scr neg, Hgb 11.6, Pap WNL, RI, RPR NR, HepBsAg neg, HIV neg, Plt 293 K, Hgb electro WNL, GC neg, Chl neg, CF neg, AFP WNL, gbbs neg, glucola 118  A&P 20yo G2P0010 @ 40+ for iol D/w pt r/b/a of iol gbbs negative, no prophylaxis Pitocin and AROM for iol Expect SVD

## 2011-07-19 ENCOUNTER — Inpatient Hospital Stay (HOSPITAL_COMMUNITY): Payer: Medicaid Other | Admitting: Anesthesiology

## 2011-07-19 ENCOUNTER — Encounter (HOSPITAL_COMMUNITY): Payer: Self-pay | Admitting: Anesthesiology

## 2011-07-19 ENCOUNTER — Encounter (HOSPITAL_COMMUNITY): Payer: Self-pay

## 2011-07-19 ENCOUNTER — Inpatient Hospital Stay (HOSPITAL_COMMUNITY)
Admission: RE | Admit: 2011-07-19 | Discharge: 2011-07-22 | DRG: 775 | Disposition: A | Discharge status: Home / Self Care | Payer: Medicaid Other | Source: Ambulatory Visit | Attending: Obstetrics and Gynecology | Admitting: Obstetrics and Gynecology

## 2011-07-19 DIAGNOSIS — Z3490 Encounter for supervision of normal pregnancy, unspecified, unspecified trimester: Secondary | ICD-10-CM

## 2011-07-19 LAB — CBC
HCT: 37.6 % (ref 36.0–46.0)
MCH: 29.4 pg (ref 26.0–34.0)
MCHC: 33.5 g/dL (ref 30.0–36.0)
MCV: 87.6 fL (ref 78.0–100.0)
Platelets: 217 10*3/uL (ref 150–400)
RDW: 13.5 % (ref 11.5–15.5)

## 2011-07-19 MED ORDER — OXYTOCIN 20 UNITS IN LACTATED RINGERS INFUSION - SIMPLE
125.0000 mL/h | Freq: Once | INTRAVENOUS | Status: DC
  Administered 2011-07-20: 125 mL/h via INTRAVENOUS

## 2011-07-19 MED ORDER — EPHEDRINE 5 MG/ML INJ
10.0000 mg | INTRAVENOUS | Status: DC | PRN
  Filled 2011-07-19: qty 4

## 2011-07-19 MED ORDER — TERBUTALINE SULFATE 1 MG/ML IJ SOLN: 0.2500 mg | Freq: Once | INTRAMUSCULAR | Status: AC | PRN

## 2011-07-19 MED ORDER — LACTATED RINGERS IV SOLN
INTRAVENOUS | Status: DC
  Administered 2011-07-19: 95 mL/h via INTRAVENOUS
  Administered 2011-07-19 (×2): via INTRAVENOUS

## 2011-07-19 MED ORDER — IBUPROFEN 600 MG PO TABS
600.0000 mg | ORAL_TABLET | Freq: Four times a day (QID) | ORAL | Status: DC | PRN
  Administered 2011-07-20: 600 mg via ORAL
  Filled 2011-07-19: qty 1

## 2011-07-19 MED ORDER — PHENYLEPHRINE 40 MCG/ML (10ML) SYRINGE FOR IV PUSH (FOR BLOOD PRESSURE SUPPORT)
80.0000 ug | PREFILLED_SYRINGE | INTRAVENOUS | Status: DC | PRN
  Filled 2011-07-19: qty 5

## 2011-07-19 MED ORDER — DIPHENHYDRAMINE HCL 50 MG/ML IJ SOLN: 12.5000 mg | INTRAMUSCULAR | Status: DC | PRN

## 2011-07-19 MED ORDER — LIDOCAINE HCL (PF) 1 % IJ SOLN
30.0000 mL | INTRAMUSCULAR | Status: DC | PRN
  Filled 2011-07-19: qty 30

## 2011-07-19 MED ORDER — OXYTOCIN BOLUS FROM INFUSION
500.0000 mL | Freq: Once | INTRAVENOUS | Status: DC
  Filled 2011-07-19: qty 500
  Filled 2011-07-19: qty 1000

## 2011-07-19 MED ORDER — PHENYLEPHRINE 40 MCG/ML (10ML) SYRINGE FOR IV PUSH (FOR BLOOD PRESSURE SUPPORT): 80.0000 ug | PREFILLED_SYRINGE | INTRAVENOUS | Status: DC | PRN

## 2011-07-19 MED ORDER — FENTANYL 2.5 MCG/ML BUPIVACAINE 1/10 % EPIDURAL INFUSION (WH - ANES)
14.0000 mL/h | INTRAMUSCULAR | Status: DC
  Administered 2011-07-19 (×2): 14 mL/h via EPIDURAL
  Filled 2011-07-19 (×3): qty 60

## 2011-07-19 MED ORDER — CITRIC ACID-SODIUM CITRATE 334-500 MG/5ML PO SOLN: 30.0000 mL | ORAL | Status: DC | PRN

## 2011-07-19 MED ORDER — LACTATED RINGERS IV SOLN: 500.0000 mL | Freq: Once | INTRAVENOUS | Status: DC

## 2011-07-19 MED ORDER — LACTATED RINGERS IV SOLN: 500.0000 mL | INTRAVENOUS | Status: DC | PRN

## 2011-07-19 MED ORDER — FENTANYL 2.5 MCG/ML BUPIVACAINE 1/10 % EPIDURAL INFUSION (WH - ANES)
INTRAMUSCULAR | Status: DC | PRN
  Administered 2011-07-19: 14 mL/h via EPIDURAL

## 2011-07-19 MED ORDER — OXYTOCIN 20 UNITS IN LACTATED RINGERS INFUSION - SIMPLE
1.0000 m[IU]/min | INTRAVENOUS | Status: DC
  Administered 2011-07-19: 2 m[IU]/min via INTRAVENOUS
  Filled 2011-07-19: qty 1000

## 2011-07-19 MED ORDER — BUTORPHANOL TARTRATE 2 MG/ML IJ SOLN
2.0000 mg | INTRAMUSCULAR | Status: DC | PRN
Start: 2011-07-19 — End: 2011-07-20
  Administered 2011-07-19: 2 mg via INTRAVENOUS
  Filled 2011-07-19: qty 1

## 2011-07-19 MED ORDER — LIDOCAINE HCL 1.5 % IJ SOLN
INTRAMUSCULAR | Status: DC | PRN
  Administered 2011-07-19 (×2): 5 mL via EPIDURAL

## 2011-07-19 MED ORDER — ACETAMINOPHEN 325 MG PO TABS: 650.0000 mg | ORAL_TABLET | ORAL | Status: DC | PRN

## 2011-07-19 MED ORDER — FLEET ENEMA 7-19 GM/118ML RE ENEM: 1.0000 | ENEMA | RECTAL | Status: DC | PRN

## 2011-07-19 MED ORDER — ONDANSETRON HCL 4 MG/2ML IJ SOLN: 4.0000 mg | Freq: Four times a day (QID) | INTRAMUSCULAR | Status: DC | PRN

## 2011-07-19 MED ORDER — EPHEDRINE 5 MG/ML INJ: 10.0000 mg | INTRAVENOUS | Status: DC | PRN

## 2011-07-19 MED ORDER — OXYCODONE-ACETAMINOPHEN 5-325 MG PO TABS: 2.0000 | ORAL_TABLET | ORAL | Status: DC | PRN

## 2011-07-19 NOTE — Plan of Care (Signed)
Problem: Consults Goal: Birthing Suites Patient Information Press F2 to bring up selections list   Pt 37-[redacted] weeks EGA     

## 2011-07-19 NOTE — Anesthesia Preprocedure Evaluation (Signed)
Anesthesia Evaluation  Patient identified by MRN, date of birth, ID band Patient awake    Reviewed: Allergy & Precautions, H&P , NPO status , Patient's Chart, lab work & pertinent test results  Airway Mallampati: II TM Distance: >3 FB Neck ROM: full    Dental No notable dental hx.    Pulmonary neg pulmonary ROS,    Pulmonary exam normal       Cardiovascular neg cardio ROS     Neuro/Psych Negative Neurological ROS  Negative Psych ROS   GI/Hepatic negative GI ROS, Neg liver ROS,   Endo/Other  Morbid obesity  Renal/GU negative Renal ROS  Genitourinary negative   Musculoskeletal negative musculoskeletal ROS (+)   Abdominal (+) obese,   Peds negative pediatric ROS (+)  Hematology negative hematology ROS (+)   Anesthesia Other Findings   Reproductive/Obstetrics (+) Pregnancy                           Anesthesia Physical Anesthesia Plan  ASA: III  Anesthesia Plan: Epidural   Post-op Pain Management:    Induction:   Airway Management Planned:   Additional Equipment:   Intra-op Plan:   Post-operative Plan:   Informed Consent: I have reviewed the patients History and Physical, chart, labs and discussed the procedure including the risks, benefits and alternatives for the proposed anesthesia with the patient or authorized representative who has indicated his/her understanding and acceptance.     Plan Discussed with:   Anesthesia Plan Comments:         Anesthesia Quick Evaluation  

## 2011-07-19 NOTE — Progress Notes (Signed)
Julia Mayer is a 21 y.o. G2P0010 at [redacted]w[redacted]d admitted for induction of labor due to Elective at term.  Subjective: Comfortable with epidural  Objective: BP 117/65  Pulse 76  Temp(Src) 98.3 F (36.8 C) (Oral)  Resp 18  Ht 5\' 3"  (1.6 m)  Wt 86.183 kg (190 lb)  BMI 33.66 kg/m2  LMP 10/12/2010      FHT:  FHR: 130's bpm, variability: moderate,  accelerations:  Present,  decelerations:  Absent UC:   regular, every 2-3 minutes SVE:   Dilation: 8.9 Effacement (%): 100 Station: +1 Exam by::Bovard, MD   Labs: Lab Results  Component Value Date   WBC 12.2* 07/19/2011   HGB 12.6 07/19/2011   HCT 37.6 07/19/2011   MCV 87.6 07/19/2011   PLT 217 07/19/2011    Assessment / Plan: Induction of labor due to term with favorable cervix,  progressing well on pitocin  Labor: Progressing on Pitocin, will continue  Preeclampsia:  no signs or symptoms of toxicity Fetal Wellbeing:  Category I Pain Control:  Epidural I/D:  n/a Anticipated MOD:  NSVD  BOVARD,Chanze Teagle 07/19/2011, 9:41 PM

## 2011-07-19 NOTE — Progress Notes (Signed)
Julia Mayer is a 21 y.o. G2P0010 at [redacted]w[redacted]d admitted for induction of labor due to Elective at term.  Subjective: No c/o's; contractions getting more uncomfortable  Objective: BP 123/81  Pulse 81  Temp(Src) 97.9 F (36.6 C) (Oral)  Resp 18  Ht 5\' 3"  (1.6 m)  Wt 86.183 kg (190 lb)  BMI 33.66 kg/m2  LMP 10/12/2010      FHT:  FHR: 130's bpm, variability: moderate,  accelerations:  Present,  decelerations:  Absent UC:   regular, every 2-3 minutes SVE:   Dilation:3.4  Effacement (%): 70 Station: -2 Exam by:: Carmelina Noun MD AROM for clear fluid w/o diff/comp  Labs: Lab Results  Component Value Date   WBC 12.2* 07/19/2011   HGB 12.6 07/19/2011   HCT 37.6 07/19/2011   MCV 87.6 07/19/2011   PLT 217 07/19/2011    Assessment / Plan: Induction of labor due to term with favorable cervix,  progressing well on pitocin  Labor: Progressing normally with pitocin and AROm for induction Preeclampsia:  no signs or symptoms of toxicity Fetal Wellbeing:  Category II Pain Control:  Labor support without medications, IV pain meds, epidural prn I/D:  n/a Anticipated MOD:  NSVD  BOVARD,Braxxton Stoudt 07/19/2011, 1:41 PM

## 2011-07-19 NOTE — Anesthesia Procedure Notes (Signed)
Epidural Patient location during procedure: OB Start time: 07/19/2011 4:02 PM End time: 07/19/2011 4:06 PM Reason for block: procedure for pain  Staffing Anesthesiologist: Sandrea Hughs Performed by: anesthesiologist   Preanesthetic Checklist Completed: patient identified, site marked, surgical consent, pre-op evaluation, timeout performed, IV checked, risks and benefits discussed and monitors and equipment checked  Epidural Patient position: sitting Prep: site prepped and draped and DuraPrep Patient monitoring: continuous pulse ox and blood pressure Approach: midline Injection technique: LOR air  Needle:  Needle type: Tuohy  Needle gauge: 17 G Needle length: 9 cm Needle insertion depth: 6 cm Catheter type: closed end flexible Catheter size: 19 Gauge Catheter at skin depth: 10 cm Test dose: negative and 1.5% lidocaine  Assessment Sensory level: T8 Events: blood not aspirated, injection not painful, no injection resistance, negative IV test and no paresthesia

## 2011-07-20 ENCOUNTER — Encounter (HOSPITAL_COMMUNITY): Payer: Self-pay

## 2011-07-20 MED ORDER — ONDANSETRON HCL 4 MG PO TABS: 4.0000 mg | ORAL_TABLET | ORAL | Status: DC | PRN

## 2011-07-20 MED ORDER — ONDANSETRON HCL 4 MG/2ML IJ SOLN: 4.0000 mg | INTRAMUSCULAR | Status: DC | PRN

## 2011-07-20 MED ORDER — BENZOCAINE-MENTHOL 20-0.5 % EX AERO
1.0000 "application " | INHALATION_SPRAY | CUTANEOUS | Status: DC | PRN
  Administered 2011-07-21: 1 via TOPICAL
  Filled 2011-07-20: qty 56

## 2011-07-20 MED ORDER — ZOLPIDEM TARTRATE 5 MG PO TABS: 5.0000 mg | ORAL_TABLET | Freq: Every evening | ORAL | Status: DC | PRN

## 2011-07-20 MED ORDER — LACTATED RINGERS IV SOLN: INTRAVENOUS | Status: DC

## 2011-07-20 MED ORDER — SENNOSIDES-DOCUSATE SODIUM 8.6-50 MG PO TABS
2.0000 | ORAL_TABLET | Freq: Every day | ORAL | Status: DC
  Administered 2011-07-20 – 2011-07-21 (×2): 2 via ORAL

## 2011-07-20 MED ORDER — PRENATAL MULTIVITAMIN CH
1.0000 | ORAL_TABLET | Freq: Every day | ORAL | Status: DC
  Administered 2011-07-20 – 2011-07-22 (×3): 1 via ORAL
  Filled 2011-07-20 (×3): qty 1

## 2011-07-20 MED ORDER — IBUPROFEN 600 MG PO TABS
600.0000 mg | ORAL_TABLET | Freq: Four times a day (QID) | ORAL | Status: DC
  Administered 2011-07-20 – 2011-07-22 (×8): 600 mg via ORAL
  Filled 2011-07-20 (×8): qty 1

## 2011-07-20 MED ORDER — OXYCODONE-ACETAMINOPHEN 5-325 MG PO TABS
1.0000 | ORAL_TABLET | ORAL | Status: DC | PRN
  Administered 2011-07-20 (×2): 1 via ORAL
  Filled 2011-07-20 (×2): qty 1

## 2011-07-20 MED ORDER — PRENATAL MULTIVITAMIN CH: 1.0000 | ORAL_TABLET | Freq: Every day | ORAL | Status: DC

## 2011-07-20 MED ORDER — SIMETHICONE 80 MG PO CHEW: 80.0000 mg | CHEWABLE_TABLET | ORAL | Status: DC | PRN

## 2011-07-20 MED ORDER — LANOLIN HYDROUS EX OINT: TOPICAL_OINTMENT | CUTANEOUS | Status: DC | PRN

## 2011-07-20 MED ORDER — TETANUS-DIPHTH-ACELL PERTUSSIS 5-2.5-18.5 LF-MCG/0.5 IM SUSP
0.5000 mL | Freq: Once | INTRAMUSCULAR | Status: AC
  Administered 2011-07-21: 0.5 mL via INTRAMUSCULAR

## 2011-07-20 MED ORDER — DIBUCAINE 1 % RE OINT
1.0000 "application " | TOPICAL_OINTMENT | RECTAL | Status: DC | PRN
  Filled 2011-07-20: qty 28

## 2011-07-20 MED ORDER — GENTAMICIN SULFATE 40 MG/ML IJ SOLN
180.0000 mg | Freq: Once | INTRAVENOUS | Status: AC
  Administered 2011-07-20: 180 mg via INTRAVENOUS
  Filled 2011-07-20: qty 4.5

## 2011-07-20 MED ORDER — SODIUM CHLORIDE 0.9 % IV SOLN
2.0000 g | Freq: Four times a day (QID) | INTRAVENOUS | Status: DC
  Administered 2011-07-20: 2 g via INTRAVENOUS
  Filled 2011-07-20 (×3): qty 2000

## 2011-07-20 MED ORDER — MEDROXYPROGESTERONE ACETATE 150 MG/ML IM SUSP: 150.0000 mg | Freq: Once | INTRAMUSCULAR | Status: DC

## 2011-07-20 MED ORDER — GENTAMICIN SULFATE 40 MG/ML IJ SOLN
160.0000 mg | Freq: Three times a day (TID) | INTRAVENOUS | Status: DC
  Filled 2011-07-20 (×2): qty 4

## 2011-07-20 MED ORDER — DIPHENHYDRAMINE HCL 25 MG PO CAPS: 25.0000 mg | ORAL_CAPSULE | Freq: Four times a day (QID) | ORAL | Status: DC | PRN

## 2011-07-20 MED ORDER — WITCH HAZEL-GLYCERIN EX PADS
1.0000 "application " | MEDICATED_PAD | CUTANEOUS | Status: DC | PRN
  Administered 2011-07-20: 1 via TOPICAL

## 2011-07-20 NOTE — Progress Notes (Signed)
Post Partum Day 0 Subjective: no complaints, up ad lib and tolerating PO nl lochia, pain controlled  Objective: Blood pressure 136/81, pulse 90, temperature 98 F (36.7 C), temperature source Oral, resp. rate 18, height 5\' 3"  (1.6 m), weight 86.183 kg (190 lb), last menstrual period 10/12/2010, unknown if currently breastfeeding.  Physical Exam:  General: alert and no distress Lochia: appropriate Uterine Fundus: firm   Basename 07/19/11 0810  HGB 12.6  HCT 37.6    Assessment/Plan: Plan for discharge tomorrow/or Monday.  Routine care   LOS: 1 day   BOVARD,Kerith Sherley 07/20/2011, 10:59 AM

## 2011-07-20 NOTE — Progress Notes (Addendum)
Julia Mayer is a 21 y.o. G2P0010 at [redacted]w[redacted]d  admitted for induction of labor due to Elective at term.  Subjective: Comfortable with epidural  Objective: BP 120/55  Pulse 83  Temp(Src) 99.3 F (37.4 C) (Oral)  Resp 18  Ht 5\' 3"  (1.6 m)  Wt 86.183 kg (190 lb)  BMI 33.66 kg/m2  LMP 10/12/2010   Total I/O In: -  Out: 550 [Urine:550]  FHT:  FHR: 150-160 bpm, variability: moderate,  accelerations:  Present,  decelerations:  Absent UC:   regular, every 2-3 minutes SVE:   Dilation: 9.5,  Effacement (%): 100 Station: +2 Exam by:: Bovard,MD   Labs: Lab Results  Component Value Date   WBC 12.2* 07/19/2011   HGB 12.6 07/19/2011   HCT 37.6 07/19/2011   MCV 87.6 07/19/2011   PLT 217 07/19/2011    Assessment / Plan: Induction of labor due to term with favorable cervix,  progressing well on pitocin  Will start pushing to reduce minimal anterior cervix  Pt with fever, on ampicillin and gentamicin  Labor: Progressing on Pitocin, Preeclampsia:  no signs or symptoms of toxicity Fetal Wellbeing:  Category II Pain Control:  Epidural I/D:  n/a Anticipated MOD:  NSVD  BOVARD,Julia Mayer 07/20/2011, 1:47 AM

## 2011-07-20 NOTE — Anesthesia Postprocedure Evaluation (Signed)
  Anesthesia Post-op Note  Patient: Julia Mayer  Procedure(s) Performed: * No procedures listed *  Patient Location: 122  Anesthesia Type: Epidural  Level of Consciousness: alert  and oriented  Airway and Oxygen Therapy: Patient Spontanous Breathing  Post-op Pain: mild  Post-op Assessment: Patient's Cardiovascular Status Stable and Respiratory Function Stable  Post-op Vital Signs: stable  Complications: No apparent anesthesia complications

## 2011-07-20 NOTE — Progress Notes (Signed)
ANTIBIOTIC CONSULT NOTE - INITIAL  Pharmacy Consult for gentamicin Indication: maternal fever - r/o chorioamniotitis  No Known Allergies  Patient Measurements: Height: 5\' 3"  (160 cm) Weight: 190 lb (86.183 kg) IBW/kg (Calculated) : 52.4  Adjusted Body Weight: 62.6 Kg  Vital Signs: Temp: 99.3 F (37.4 C) (01/12 0015) Temp src: Oral (01/12 0015) BP: 116/57 mmHg (01/12 0030) Pulse Rate: 85  (01/12 0030) Intake/Output from previous day: 01/11 0701 - 01/12 0700 In: -  Out: 550 [Urine:550] Intake/Output from this shift: Total I/O In: -  Out: 550 [Urine:550]  Labs:  [facility] 07/19/11 0810  WBC 12.2*  HGB 12.6  PLT 217  LABCREA --  CREATININE --      Microbiology: No results found for this or any previous visit (from the past 720 hour(s)).  Medical History: Past Medical History  Diagnosis Date  . No pertinent past medical history   . Chlamydia   . UTI (urinary tract infection)   . Urinary tract infection during pregnancy 06/15/2011  . Normal pregnancy 07/19/2011    Medications:     Ampicillin 2gm IV q6h  Assessment:   Est CrCl for age with normal output for pregnancy is approx 140 ml/min    Coverage for pelvic infection  Goal of Therapy:    Desire peak gentamicin level 6.5 - 8 mcg/ml & trough <1 mcg/ml  Plan:  1. Initial loading dose 180mg  gentamicin x 1 , then 2. Maintenance regimen 160mg  IV q8h, with 3. Serum creatinine to confirm estimated CrCl later in am 4.  Will measure actual serum gentamicin levels if Tx continued >48-72hr or as clinically indicated.  Shirley Muscat E 07/20/2011,12:39 AM

## 2011-07-21 LAB — CBC
MCH: 28.7 pg (ref 26.0–34.0)
MCHC: 32.4 g/dL (ref 30.0–36.0)
Platelets: 206 10*3/uL (ref 150–400)
RDW: 13.9 % (ref 11.5–15.5)

## 2011-07-21 MED ORDER — BENZOCAINE-MENTHOL 20-0.5 % EX AERO
INHALATION_SPRAY | CUTANEOUS | Status: AC
  Administered 2011-07-21: 1 via TOPICAL
  Filled 2011-07-21: qty 56

## 2011-07-21 NOTE — Progress Notes (Signed)
Post Partum Day 1 Subjective: no complaints, up ad lib, tolerating PO and nl lochia, pain controlled  Objective: Blood pressure 120/77, pulse 86, temperature 98 F (36.7 C), temperature source Oral, resp. rate 18, height 5\' 3"  (1.6 m), weight 86.183 kg (190 lb), unknown if currently breastfeeding.  Physical Exam:  General: alert and no distress Lochia: appropriate Uterine Fundus: firm   Basename 07/21/11 0526 07/19/11 0810  HGB 11.0* 12.6  HCT 33.9* 37.6    Assessment/Plan: Plan for discharge tomorrow and Lactation consult   LOS: 2 days   BOVARD,Rosamond Andress 07/21/2011, 9:28 AM

## 2011-07-22 MED ORDER — IBUPROFEN 800 MG PO TABS: 800.0000 mg | ORAL_TABLET | Freq: Three times a day (TID) | ORAL | Status: AC | PRN

## 2011-07-22 MED ORDER — PRENATAL MULTIVITAMIN CH: 1.0000 | ORAL_TABLET | Freq: Every day | ORAL | Status: DC

## 2011-07-22 MED ORDER — OXYCODONE-ACETAMINOPHEN 5-325 MG PO TABS: 1.0000 | ORAL_TABLET | Freq: Four times a day (QID) | ORAL | Status: AC | PRN

## 2011-07-22 NOTE — Progress Notes (Signed)
Post Partum Day 2 Subjective: no complaints, up ad lib, voiding, tolerating PO, + flatus and nl lochia, pain controlled  Objective: Blood pressure 118/81, pulse 74, temperature 98.1 F (36.7 C), temperature source Oral, resp. rate 20, height 5\' 3"  (1.6 m), weight 86.183 kg (190 lb), unknown if currently breastfeeding.  Physical Exam:  General: alert and no distress Lochia: appropriate Uterine Fundus: firm   Basename 07/21/11 0526  HGB 11.0*  HCT 33.9*    Assessment/Plan: Discharge home and Contraception depo prior to d/c   LOS: 3 days   BOVARD,Alisa Stjames 07/22/2011, 8:59 AM

## 2011-07-22 NOTE — Discharge Summary (Signed)
Obstetric Discharge Summary Reason for Admission: induction of labor Prenatal Procedures: none Intrapartum Procedures: spontaneous vaginal delivery Postpartum Procedures: none Complications-Operative and Postpartum: 2nd degree perineal laceration Hemoglobin  Date Value Range Status  07/21/2011 11.0* 12.0-15.0 (g/dL) Final     HCT  Date Value Range Status  07/21/2011 33.9* 36.0-46.0 (%) Final    Discharge Diagnoses: Term Pregnancy-delivered  Discharge Information: Date: 07/22/2011 Activity: pelvic rest Diet: routine Medications: PNV, Ibuprofen and Percocet Condition: stable Instructions: refer to practice specific booklet Discharge to: home Follow-up Information    Follow up with BOVARD,Maurizio Geno, MD. Schedule an appointment as soon as possible for a visit in 6 weeks.   Contact information:   [address] [facility] [address] [place] [address] [phone]          Newborn Data: Live born female  Birth Weight: 6 lb 14 oz (3118 g) APGAR: 9, 9  Home with mother.  BOVARD,Brynne Doane 07/22/2011, 9:07 AM

## 2011-07-22 NOTE — Progress Notes (Signed)
Post discharge chart review completed.  

## 2011-10-12 ENCOUNTER — Emergency Department (HOSPITAL_COMMUNITY)
Admission: EM | Admit: 2011-10-12 | Discharge: 2011-10-12 | Disposition: A | Discharge status: Home / Self Care | Payer: Medicaid Other | Attending: Emergency Medicine | Admitting: Emergency Medicine

## 2011-10-12 ENCOUNTER — Encounter (HOSPITAL_COMMUNITY): Payer: Self-pay

## 2011-10-12 DIAGNOSIS — J020 Streptococcal pharyngitis: Secondary | ICD-10-CM

## 2011-10-12 MED ORDER — OXYCODONE-ACETAMINOPHEN 5-325 MG PO TABS: 2.0000 | ORAL_TABLET | ORAL | Status: AC | PRN

## 2011-10-12 MED ORDER — IBUPROFEN 800 MG PO TABS
800.0000 mg | ORAL_TABLET | Freq: Once | ORAL | Status: AC
  Administered 2011-10-12: 800 mg via ORAL
  Filled 2011-10-12: qty 1

## 2011-10-12 MED ORDER — PENICILLIN G BENZATHINE 1200000 UNIT/2ML IM SUSP
1.2000 10*6.[IU] | Freq: Once | INTRAMUSCULAR | Status: AC
  Administered 2011-10-12: 1.2 10*6.[IU] via INTRAMUSCULAR
  Filled 2011-10-12: qty 2

## 2011-10-12 NOTE — Discharge Instructions (Signed)
Julia Mayer you have strep throat.  Stay away from baby at least 12 hours.  We treated you with Bicillin IM injection in the ER today.  Continue taking ibuprofen for pain.  The percocet is for severe pain.  Drink at least 8 glasses of water today for hydration.  Return to ER for uncontrolled nausea and vomiting or other concerns. Change out your tooth brush.    Strep Throat Strep throat is an infection of the throat caused by a bacteria named Streptococcus pyogenes. Your caregiver may call the infection streptococcal "tonsillitis" or "pharyngitis" depending on whether there are signs of inflammation in the tonsils or back of the throat. Strep throat is most common in children from 52 to 89 years old during the cold months of the year, but it can occur in people of any age during any season. This infection is spread from person to person (contagious) through coughing, sneezing, or other close contact. SYMPTOMS   Fever or chills.   Painful, swollen, red tonsils or throat.   Pain or difficulty when swallowing.   White or yellow spots on the tonsils or throat.   Swollen, tender lymph nodes or "glands" of the neck or under the jaw.   Red rash all over the body (rare).  DIAGNOSIS  Many different infections can cause the same symptoms. A test must be done to confirm the diagnosis so the right treatment can be given. A "rapid strep test" can help your caregiver make the diagnosis in a few minutes. If this test is not available, a light swab of the infected area can be used for a throat culture test. If a throat culture test is done, results are usually available in a day or two. TREATMENT  Strep throat is treated with antibiotic medicine. HOME CARE INSTRUCTIONS   Gargle with 1 tsp of salt in 1 cup of warm water, 3 to 4 times per day or as needed for comfort.   Family members who also have a sore throat or fever should be tested for strep throat and treated with antibiotics if they have the strep  infection.   Make sure everyone in your household washes their hands well.   Do not share food, drinking cups, or personal items that could cause the infection to spread to others.   You may need to eat a soft food diet until your sore throat gets better.   Drink enough water and fluids to keep your urine clear or pale yellow. This will help prevent dehydration.   Get plenty of rest.   Stay home from school, daycare, or work until you have been on antibiotics for 24 hours.   Only take over-the-counter or prescription medicines for pain, discomfort, or fever as directed by your caregiver.   If antibiotics are prescribed, take them as directed. Finish them even if you start to feel better.  SEEK MEDICAL CARE IF:   The glands in your neck continue to enlarge.   You develop a rash, cough, or earache.   You cough up green, yellow-brown, or bloody sputum.   You have pain or discomfort not controlled by medicines.   Your problems seem to be getting worse rather than better.  SEEK IMMEDIATE MEDICAL CARE IF:   You develop any new symptoms such as vomiting, severe headache, stiff or painful neck, chest pain, shortness of breath, or trouble swallowing.   You develop severe throat pain, drooling, or changes in your voice.   You develop swelling of the  neck, or the skin on the neck becomes red and tender.   You have a fever.   You develop signs of dehydration, such as fatigue, dry mouth, and decreased urination.   You become increasingly sleepy, or you cannot wake up completely.  Document Released: 06/21/2000 Document Revised: 06/13/2011 Document Reviewed: 08/23/2010 [facility] Patient Information 2012 [place], [place].Strep Throat Strep throat is an infection of the throat caused by a bacteria named Streptococcus pyogenes. Your caregiver may call the infection streptococcal "tonsillitis" or "pharyngitis" depending on whether there are signs of inflammation in the tonsils or back of the  throat. Strep throat is most common in children from 75 to 71 years old during the cold months of the year, but it can occur in people of any age during any season. This infection is spread from person to person (contagious) through coughing, sneezing, or other close contact. SYMPTOMS   Fever or chills.   Painful, swollen, red tonsils or throat.   Pain or difficulty when swallowing.   White or yellow spots on the tonsils or throat.   Swollen, tender lymph nodes or "glands" of the neck or under the jaw.   Red rash all over the body (rare).  DIAGNOSIS  Many different infections can cause the same symptoms. A test must be done to confirm the diagnosis so the right treatment can be given. A "rapid strep test" can help your caregiver make the diagnosis in a few minutes. If this test is not available, a light swab of the infected area can be used for a throat culture test. If a throat culture test is done, results are usually available in a day or two. TREATMENT  Strep throat is treated with antibiotic medicine. HOME CARE INSTRUCTIONS   Gargle with 1 tsp of salt in 1 cup of warm water, 3 to 4 times per day or as needed for comfort.   Family members who also have a sore throat or fever should be tested for strep throat and treated with antibiotics if they have the strep infection.   Make sure everyone in your household washes their hands well.   Do not share food, drinking cups, or personal items that could cause the infection to spread to others.   You may need to eat a soft food diet until your sore throat gets better.   Drink enough water and fluids to keep your urine clear or pale yellow. This will help prevent dehydration.   Get plenty of rest.   Stay home from school, daycare, or work until you have been on antibiotics for 24 hours.   Only take over-the-counter or prescription medicines for pain, discomfort, or fever as directed by your caregiver.   If antibiotics are prescribed,  take them as directed. Finish them even if you start to feel better.  SEEK MEDICAL CARE IF:   The glands in your neck continue to enlarge.   You develop a rash, cough, or earache.   You cough up green, yellow-brown, or bloody sputum.   You have pain or discomfort not controlled by medicines.   Your problems seem to be getting worse rather than better.  SEEK IMMEDIATE MEDICAL CARE IF:   You develop any new symptoms such as vomiting, severe headache, stiff or painful neck, chest pain, shortness of breath, or trouble swallowing.   You develop severe throat pain, drooling, or changes in your voice.   You develop swelling of the neck, or the skin on the neck becomes red and tender.  You have a fever.   You develop signs of dehydration, such as fatigue, dry mouth, and decreased urination.   You become increasingly sleepy, or you cannot wake up completely.  Document Released: 06/21/2000 Document Revised: 06/13/2011 Document Reviewed: 08/23/2010 [facility] Patient Information 2012 [place], [place].Strep Throat Strep throat is an infection of the throat caused by a bacteria named Streptococcus pyogenes. Your caregiver may call the infection streptococcal "tonsillitis" or "pharyngitis" depending on whether there are signs of inflammation in the tonsils or back of the throat. Strep throat is most common in children from 65 to 66 years old during the cold months of the year, but it can occur in people of any age during any season. This infection is spread from person to person (contagious) through coughing, sneezing, or other close contact. SYMPTOMS   Fever or chills.   Painful, swollen, red tonsils or throat.   Pain or difficulty when swallowing.   White or yellow spots on the tonsils or throat.   Swollen, tender lymph nodes or "glands" of the neck or under the jaw.   Red rash all over the body (rare).  DIAGNOSIS  Many different infections can cause the same symptoms. A test must be done  to confirm the diagnosis so the right treatment can be given. A "rapid strep test" can help your caregiver make the diagnosis in a few minutes. If this test is not available, a light swab of the infected area can be used for a throat culture test. If a throat culture test is done, results are usually available in a day or two. TREATMENT  Strep throat is treated with antibiotic medicine. HOME CARE INSTRUCTIONS   Gargle with 1 tsp of salt in 1 cup of warm water, 3 to 4 times per day or as needed for comfort.   Family members who also have a sore throat or fever should be tested for strep throat and treated with antibiotics if they have the strep infection.   Make sure everyone in your household washes their hands well.   Do not share food, drinking cups, or personal items that could cause the infection to spread to others.   You may need to eat a soft food diet until your sore throat gets better.   Drink enough water and fluids to keep your urine clear or pale yellow. This will help prevent dehydration.   Get plenty of rest.   Stay home from school, daycare, or work until you have been on antibiotics for 24 hours.   Only take over-the-counter or prescription medicines for pain, discomfort, or fever as directed by your caregiver.   If antibiotics are prescribed, take them as directed. Finish them even if you start to feel better.  SEEK MEDICAL CARE IF:   The glands in your neck continue to enlarge.   You develop a rash, cough, or earache.   You cough up green, yellow-brown, or bloody sputum.   You have pain or discomfort not controlled by medicines.   Your problems seem to be getting worse rather than better.  SEEK IMMEDIATE MEDICAL CARE IF:   You develop any new symptoms such as vomiting, severe headache, stiff or painful neck, chest pain, shortness of breath, or trouble swallowing.   You develop severe throat pain, drooling, or changes in your voice.   You develop swelling of  the neck, or the skin on the neck becomes red and tender.   You have a fever.   You develop signs of dehydration,  such as fatigue, dry mouth, and decreased urination.   You become increasingly sleepy, or you cannot wake up completely.  Document Released: 06/21/2000 Document Revised: 06/13/2011 Document Reviewed: 08/23/2010 [facility] Patient Information 2012 [place], [place].Salt Water Gargle This solution will help make your mouth and throat feel better. HOME CARE INSTRUCTIONS   Mix 1 teaspoon of salt in 8 ounces of warm water.   Gargle with this solution as much or often as you need or as directed. Swish and gargle gently if you have any sores or wounds in your mouth.   Do not swallow this mixture.  Document Released: 03/28/2004 Document Revised: 06/13/2011 Document Reviewed: 08/19/2008 [facility] Patient Information 2012 [place], [place]. Salt Water Gargle This solution will help make your mouth and throat feel better. HOME CARE INSTRUCTIONS   Mix 1 teaspoon of salt in 8 ounces of warm water.   Gargle with this solution as much or often as you need or as directed. Swish and gargle gently if you have any sores or wounds in your mouth.   Do not swallow this mixture.  Document Released: 03/28/2004 Document Revised: 06/13/2011 Document Reviewed: 08/19/2008 [facility] Patient Information 2012 [place], [place].

## 2011-10-12 NOTE — ED Provider Notes (Signed)
History     CSN: [id number]  Arrival date & time 10/12/11  3086   None     Chief Complaint  Patient presents with  . Sore Throat    (Consider location/radiation/quality/duration/timing/severity/associated sxs/prior treatment) Patient is a 21 y.o. female presenting with pharyngitis. The history is provided by the patient. No language interpreter was used.  Sore Throat This is a new problem. The current episode started in the past 7 days. The problem occurs constantly. The problem has been gradually worsening. Associated symptoms include a sore throat and swollen glands. Pertinent negatives include no abdominal pain, congestion, coughing, fever, nausea, rash or vomiting. The symptoms are aggravated by drinking and eating. Treatments tried: nyquill, mucinex.   Sore throat x 1 week.  Denies fever, cough,  nausea and vomiting.  Has a 30 month old baby. Past Medical History  Diagnosis Date  . No pertinent past medical history   . Chlamydia   . UTI (urinary tract infection)   . Urinary tract infection during pregnancy 06/15/2011  . Normal pregnancy 07/19/2011  . SVD (spontaneous vaginal delivery) 07/20/2011    Past Surgical History  Procedure Date  . No past surgeries   . 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   . Induced abortion     Family History  Problem Relation Age of Onset  . Hypertension Mother   . Asthma Mother   . Alcohol abuse Mother   . Hypertension Father   . Hyperlipidemia Father   . Cancer Maternal Grandmother   . Cancer Paternal Grandmother   . Anesthesia problems Paternal Grandmother     History  Substance Use Topics  . Smoking status: Never Smoker   . Smokeless tobacco: Never Used  . Alcohol Use: No     occasionally but not during pregnancy    OB History    Grav Para Term Preterm Abortions TAB SAB Ect Mult Living   3 1 1  1 1    1       Review of Systems  Unable to perform ROS Constitutional: Negative.  Negative for fever.  HENT: Positive for  sore throat. Negative for ear pain, congestion, facial swelling, rhinorrhea, sneezing, trouble swallowing, voice change, postnasal drip and sinus pressure.   Eyes: Negative.   Respiratory: Negative.  Negative for cough and shortness of breath.   Cardiovascular: Negative.   Gastrointestinal: Negative.  Negative for nausea, vomiting and abdominal pain.  Skin: Negative for rash.  Neurological: Positive for speech difficulty. Negative for dizziness and light-headedness.  Psychiatric/Behavioral: Negative.   All other systems reviewed and are negative.    Allergies  Review of patient's allergies indicates no known allergies.  Home Medications   Current Outpatient Rx  Name Route Sig Dispense Refill  . PRENATAL MULTIVITAMIN CH Oral Take 1 tablet by mouth daily.        BP 116/81  Pulse 111  Temp 99 F (37.2 C)  Resp 18  SpO2 100%  LMP 09/25/2011  Breastfeeding? No  Physical Exam  Nursing note and vitals reviewed. Constitutional: She is oriented to person, place, and time. She appears well-developed and well-nourished.  HENT:  Head: Normocephalic and atraumatic.  Right Ear: Tympanic membrane normal.  Left Ear: Tympanic membrane normal.  Nose: Nose normal.  Mouth/Throat: Uvula is midline and mucous membranes are normal. Posterior oropharyngeal edema and posterior oropharyngeal erythema present. No oropharyngeal exudate or tonsillar abscesses.  Eyes: Conjunctivae and EOM are normal. Pupils are equal, round, and reactive  to light.  Neck: Normal range of motion. Neck supple.  Cardiovascular: Normal rate.   Pulmonary/Chest: Effort normal.  Abdominal: Soft.  Musculoskeletal: Normal range of motion. She exhibits no edema and no tenderness.  Neurological: She is alert and oriented to person, place, and time. She has normal reflexes.  Skin: Skin is warm and dry.  Psychiatric: She has a normal mood and affect.    ED Course  Procedures (including critical care time)  Labs Reviewed    RAPID STREP SCREEN - Abnormal; Notable for the following:    Streptococcus, Group A Screen (Direct) POSITIVE (*)    All other components within normal limits   No results found.   No diagnosis found.    MDM  + strep in ER treated with Bicillin 1.26ml units IM.  And ibuprofen.  Encourage hydration.  Does not appear ill.  Will return if worse with nausea and vomiting.  No pcp         Jethro Bastos, NP 10/13/11 1824

## 2011-10-14 NOTE — ED Provider Notes (Signed)
Medical screening examination/treatment/procedure(s) were performed by non-physician practitioner and as supervising physician I was immediately available for consultation/collaboration.   [name], MD 10/14/11 0709 

## 2012-01-14 ENCOUNTER — Encounter (HOSPITAL_COMMUNITY): Payer: Self-pay | Admitting: *Deleted

## 2012-01-14 ENCOUNTER — Emergency Department (HOSPITAL_COMMUNITY)
Admission: EM | Admit: 2012-01-14 | Discharge: 2012-01-14 | Disposition: A | Discharge status: Home / Self Care | Payer: No Typology Code available for payment source | Attending: Emergency Medicine | Admitting: Emergency Medicine

## 2012-01-14 DIAGNOSIS — M545 Low back pain, unspecified: Secondary | ICD-10-CM | POA: Insufficient documentation

## 2012-01-14 DIAGNOSIS — S335XXA Sprain of ligaments of lumbar spine, initial encounter: Secondary | ICD-10-CM | POA: Insufficient documentation

## 2012-01-14 DIAGNOSIS — S39012A Strain of muscle, fascia and tendon of lower back, initial encounter: Secondary | ICD-10-CM

## 2012-01-14 MED ORDER — CYCLOBENZAPRINE HCL 10 MG PO TABS: 10.0000 mg | ORAL_TABLET | Freq: Three times a day (TID) | ORAL | Status: AC | PRN

## 2012-01-14 MED ORDER — NAPROXEN 500 MG PO TABS: 500.0000 mg | ORAL_TABLET | Freq: Two times a day (BID) | ORAL | Status: AC
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
## 2012-01-15 NOTE — ED Provider Notes (Signed)
Medical screening examination/treatment/procedure(s) were performed by non-physician practitioner and as supervising physician I was immediately available for consultation/collaboration.   Celene Kras, MD 01/15/12 [phone]

## 2014-05-09 ENCOUNTER — Encounter (HOSPITAL_COMMUNITY): Payer: Self-pay | Admitting: *Deleted

## 2016-02-27 ENCOUNTER — Encounter (HOSPITAL_COMMUNITY): Payer: Self-pay | Admitting: *Deleted

## 2016-02-27 ENCOUNTER — Inpatient Hospital Stay (HOSPITAL_COMMUNITY)
Admission: AD | Admit: 2016-02-27 | Discharge: 2016-02-27 | Disposition: A | Discharge status: Home / Self Care | Payer: Medicaid Other | Source: Ambulatory Visit | Attending: Family Medicine | Admitting: Family Medicine

## 2016-02-27 DIAGNOSIS — Z8249 Family history of ischemic heart disease and other diseases of the circulatory system: Secondary | ICD-10-CM | POA: Insufficient documentation

## 2016-02-27 DIAGNOSIS — N760 Acute vaginitis: Secondary | ICD-10-CM | POA: Insufficient documentation

## 2016-02-27 DIAGNOSIS — Z825 Family history of asthma and other chronic lower respiratory diseases: Secondary | ICD-10-CM | POA: Insufficient documentation

## 2016-02-27 MED ORDER — TERCONAZOLE 0.4 % VA CREA: 1.0000 | TOPICAL_CREAM | Freq: Every day | VAGINAL | 0 refills | Status: DC

## 2016-02-27 NOTE — MAU Provider Note (Signed)
History     CSN: [id number]  Arrival date and time: 02/27/16 2019   First Provider Initiated Contact with Patient 02/27/16 2151      No chief complaint on file.  HPI   Ms.Julia Mayer is a 25 y.o. female [phone] here in MAU with vaginal itching and color changes or her toenails. She noticed it about a month ago on her toe nails only. She feels they are yellow. No history of anemia. No itching on the toes.   The vaginal itching started about 4 days ago. She has not tried anything over the counter. No lesions or blisters noted. Was on antibiotics recently for UTI.  She denies vaginal bleeding or abdominal pain.   OB History    Gravida Para Term Preterm AB Living   6 1 1   3  0   SAB TAB Ectopic Multiple Live Births     3     0      Past Medical History:  Diagnosis Date  . Chlamydia   . No pertinent past medical history   . Normal pregnancy 07/19/2011  . SVD (spontaneous vaginal delivery) 07/20/2011  . Urinary tract infection during pregnancy 06/15/2011  . UTI (urinary tract infection)     Past Surgical History:  Procedure Laterality Date  . 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   . INDUCED ABORTION    . NO PAST SURGERIES      Family History  Problem Relation Age of Onset  . Hypertension Mother   . Asthma Mother   . Alcohol abuse Mother   . Hypertension Father   . Hyperlipidemia Father   . Cancer Maternal Grandmother   . Cancer Paternal Grandmother   . Anesthesia problems Paternal Grandmother     Social History  Substance Use Topics  . Smoking status: Never Smoker  . Smokeless tobacco: Never Used  . Alcohol use No     Comment: occasionally but not during pregnancy    Allergies: No Known Allergies  Prescriptions Prior to Admission  Medication Sig Dispense Refill Last Dose  . Prenatal Vit-Fe Fumarate-FA (PRENATAL MULTIVITAMIN) TABS Take 1 tablet by mouth daily.     10/11/2011 at Unknown   No results found for this or any previous visit (from the past 48  hour(s)).  Review of Systems  Constitutional: Negative for chills and fever.  Gastrointestinal: Negative for abdominal pain.  Genitourinary: Negative for dysuria and urgency.   Physical Exam   Blood pressure 104/61, pulse 82, temperature 98.1 F (36.7 C), temperature source Oral, resp. rate 20, height 5\' 2"  (1.575 m), weight 190 lb (86.2 kg), last menstrual period 10/19/2015.  Physical Exam  Constitutional: She is oriented to person, place, and time. She appears well-developed and well-nourished. No distress.  HENT:  Head: Normocephalic.  Eyes: Pupils are equal, round, and reactive to light.  Respiratory: Effort normal.  Musculoskeletal: Normal range of motion.       Feet:  Toenail fungus noted bilaterally   Neurological: She is alert and oriented to person, place, and time.  Skin: Skin is warm. She is not diaphoretic.  Psychiatric: Her behavior is normal.    MAU Course  Procedures  None  MDM  + fetal heart tones via doppler.  Patient waiting in the lobby for a significant period of time due to acuity of unit. Discussed waiting for exam room vs. Treating imperially. Patient has a history of yeast infections and recently finished antibiotics> likely yeast  vaginitis.  Changes to her toe nails likely due to fungal infection. Patient instructed to follow up with dermatology for treatment options.   Assessment and Plan   A:  1. Vaginitis and vulvovaginitis     P:  Discharge home in stable condition Follow up with [facility] as scheduled for first OB appointment Return to MAU for emergencies Rx: Terazol 7 day Patient needs to see dermatology    Lezlie Lye, NP 02/27/2016 10:03 PM

## 2016-02-27 NOTE — MAU Note (Signed)
PT  SAYS SHE  HAD PREG  CONFIRMED  IN [place].    HAS  RECENTLY  MOVED  HERE.    VAG ITCHING  STARTED   SAT -    NO CREAMS  FOR    THIS.  HAS  HX  OF  YEAST  INFECTION  -  LAST   TIME   WAS  2013.       ALSO  SAYS  HER TOENAILS   ARE   TURNING  BROWN  .   LAST SEX-   June    HAS AN APPOINTMENT   WITH DR Raphael Gibney   ON  8-24.

## 2016-02-27 NOTE — Discharge Instructions (Signed)

## 2016-04-22 ENCOUNTER — Inpatient Hospital Stay (HOSPITAL_COMMUNITY)
Admission: AD | Admit: 2016-04-22 | Discharge: 2016-04-22 | Disposition: A | Discharge status: Home / Self Care | Payer: Medicaid Other | Source: Ambulatory Visit | Attending: Family Medicine | Admitting: Family Medicine

## 2016-04-22 ENCOUNTER — Encounter (HOSPITAL_COMMUNITY): Payer: Self-pay | Admitting: *Deleted

## 2016-04-22 DIAGNOSIS — R109 Unspecified abdominal pain: Secondary | ICD-10-CM | POA: Insufficient documentation

## 2016-04-22 DIAGNOSIS — Z3A26 26 weeks gestation of pregnancy: Secondary | ICD-10-CM | POA: Diagnosis not present

## 2016-04-22 DIAGNOSIS — O9989 Other specified diseases and conditions complicating pregnancy, childbirth and the puerperium: Secondary | ICD-10-CM | POA: Diagnosis not present

## 2016-04-22 DIAGNOSIS — O26892 Other specified pregnancy related conditions, second trimester: Secondary | ICD-10-CM | POA: Insufficient documentation

## 2016-04-22 LAB — URINALYSIS, ROUTINE W REFLEX MICROSCOPIC
BILIRUBIN URINE: NEGATIVE
Glucose, UA: NEGATIVE mg/dL
HGB URINE DIPSTICK: NEGATIVE
Ketones, ur: NEGATIVE mg/dL
NITRITE: NEGATIVE
PH: 7 (ref 5.0–8.0)
Protein, ur: NEGATIVE mg/dL
SPECIFIC GRAVITY, URINE: 1.02 (ref 1.005–1.030)

## 2016-04-22 LAB — URINE MICROSCOPIC-ADD ON: RBC / HPF: NONE SEEN RBC/hpf (ref 0–5)

## 2016-04-22 LAB — WET PREP, GENITAL
Clue Cells Wet Prep HPF POC: NONE SEEN
SPERM: NONE SEEN
TRICH WET PREP: NONE SEEN
YEAST WET PREP: NONE SEEN

## 2016-04-22 MED ORDER — POLYETHYLENE GLYCOL 3350 17 G PO PACK: 17.0000 g | PACK | Freq: Every day | ORAL | 3 refills | Status: DC

## 2016-04-22 NOTE — Discharge Instructions (Signed)
Constipation, Adult Constipation is when a person:  Poops (has a bowel movement) less than 3 times a week.  Has a hard time pooping.  Has poop that is dry, hard, or bigger than normal. HOME CARE   Eat foods with a lot of fiber in them. This includes fruits, vegetables, beans, and whole grains such as brown rice.  Avoid fatty foods and foods with a lot of sugar. This includes french fries, hamburgers, cookies, candy, and soda.  If you are not getting enough fiber from food, take products with added fiber in them (supplements).  Drink enough fluid to keep your pee (urine) clear or pale yellow.  Exercise on a regular basis, or as told by your doctor.  Go to the restroom when you feel like you need to poop. Do not hold it.  Only take medicine as told by your doctor. Do not take medicines that help you poop (laxatives) without talking to your doctor first. GET HELP RIGHT AWAY IF:   You have bright red blood in your poop (stool).  Your constipation lasts more than 4 days or gets worse.  You have belly (abdominal) or butt (rectal) pain.  You have thin poop (as thin as a pencil).  You lose weight, and it cannot be explained. MAKE SURE YOU:   Understand these instructions.  Will watch your condition.  Will get help right away if you are not doing well or get worse.   This information is not intended to replace advice given to you by your health care provider. Make sure you discuss any questions you have with your health care provider.   Document Released: 12/11/2007 Document Revised: 07/15/2014 Document Reviewed: 04/05/2013 Elsevier Interactive Patient Education 2016 Else[employer] Inc[employer]   Abdominal Pain During Pregnancy Abdominal pain is common in pregnancy. Most of the time, it does not cause harm. There are many causes of abdominal pain. Some causes are more serious than others. Some of the causes of abdominal pain in pregnancy are easily diagnosed. Occasionally, the diagnosis  takes time to understand. Other times, the cause is not determined. Abdominal pain can be a sign that something is very wrong with the pregnancy, or the pain may have nothing to do with the pregnancy at all. For this reason, always tell your health care provider if you have any abdominal discomfort. HOME CARE INSTRUCTIONS  Monitor your abdominal pain for any changes. The following actions may help to alleviate any discomfort you are experiencing:  Do not have sexual intercourse or put anything in your vagina until your symptoms go away completely.  Get plenty of rest until your pain improves.  Drink clear fluids if you feel nauseous. Avoid solid food as long as you are uncomfortable or nauseous.  Only take over-the-counter or prescription medicine as directed by your health care provider.  Keep all follow-up appointments with your health care provider. SEEK IMMEDIATE MEDICAL CARE IF:  You are bleeding, leaking fluid, or passing tissue from the vagina.  You have increasing pain or cramping.  You have persistent vomiting.  You have painful or bloody urination.  You have a fever.  You notice a decrease in your baby's movements.  You have extreme weakness or feel faint.  You have shortness of breath, with or without abdominal pain.  You develop a severe headache with abdominal pain.  You have abnormal vaginal discharge with abdominal pain.  You have persistent diarrhea.  You have abdominal pain that continues even after rest, or gets worse. MAKE  SURE YOU:   Understand these instructions.  Will watch your condition.  Will get help right away if you are not doing well or get worse.   This information is not intended to replace advice given to you by your health care provider. Make sure you discuss any questions you have with your health care provider.   Document Released: 06/24/2005 Document Revised: 04/14/2013 Document Reviewed: 01/21/2013 Elsevier Interactive Patient  Education [employer].

## 2016-04-22 NOTE — MAU Provider Note (Signed)
MAU HISTORY AND PHYSICAL  Chief Complaint:  Abdominal Pain  Julia Mayer is a 25 y.o.  [id number]  at [redacted]w[redacted]d presenting for Abdominal Pain . Patient states she has been having  none contractions, none vaginal bleeding, intact membranes, with active fetal movement.    Woke up this morning with lower abdominal discomfort. Felt it may be gas, had a small BM which improved discomfort. Has difficulty with normal BMs, hard stools every other day. Also has had some vaginal itching past 2 days. Has had burning with urination after scratching. Has not noticed abnormal discharge. Denies frequency/urgency.   Past Medical History:  Diagnosis Date  . Chlamydia   . No pertinent past medical history   . Normal pregnancy 07/19/2011  . SVD (spontaneous vaginal delivery) 07/20/2011  . Urinary tract infection during pregnancy 06/15/2011  . UTI (urinary tract infection)     Past Surgical History:  Procedure Laterality Date  . 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   . INDUCED ABORTION      Family History  Problem Relation Age of Onset  . Hypertension Mother   . Asthma Mother   . Alcohol abuse Mother   . Hypertension Father   . Hyperlipidemia Father   . Cancer Maternal Grandmother   . Cancer Paternal Grandmother   . Anesthesia problems Paternal Grandmother     Social History  Substance Use Topics  . Smoking status: Never Smoker  . Smokeless tobacco: Never Used  . Alcohol use No     Comment: occasionally but not during pregnancy    No Known Allergies  Prescriptions Prior to Admission  Medication Sig Dispense Refill Last Dose  . Prenatal Vit-Fe Fumarate-FA (PRENATAL MULTIVITAMIN) TABS Take 1 tablet by mouth daily.     10/11/2011 at Unknown  . terconazole (TERAZOL 7) 0.4 % vaginal cream Place 1 applicator vaginally at bedtime. 45 g 0     Review of Systems - Negative except for what is mentioned in HPI.  Physical Exam  Blood pressure 110/66, pulse 107, temperature 98 F (36.7 C),  temperature source Oral, resp. rate 18, last menstrual period 10/19/2015. GENERAL: Well-developed, well-nourished female in no acute distress.  LUNGS: No respiratory distress HEART: Regular rate ABDOMEN: Soft, nontender, nondistended, gravid abdomen EXTREMITIES: Nontender, no edema, 2+ distal pulses. GU: no discharge or lesions noted FHT:  Baseline 150, moderate variability, accels present no decels Contractions: none Dilation: Closed (Outer os slightly open) Effacement (%): Thick Cervical Position: Posterior Station: Ballotable Presentation: Undeterminable Exam by:: Dr. Stefano Gaul. Rulon Eisenmenger, RNC    Labs: Results for orders placed or performed during the hospital encounter of 04/22/16 (from the past 24 hour(s))  Urinalysis, Routine w reflex microscopic (not at [facility])   Collection Time: 04/22/16  8:52 AM  Result Value Ref Range   Color, Urine YELLOW YELLOW   APPearance CLEAR CLEAR   Specific Gravity, Urine 1.020 1.005 - 1.030   pH 7.0 5.0 - 8.0   Glucose, UA NEGATIVE NEGATIVE mg/dL   Hgb urine dipstick NEGATIVE NEGATIVE   Bilirubin Urine NEGATIVE NEGATIVE   Ketones, ur NEGATIVE NEGATIVE mg/dL   Protein, ur NEGATIVE NEGATIVE mg/dL   Nitrite NEGATIVE NEGATIVE   Leukocytes, UA MODERATE (A) NEGATIVE  Urine microscopic-add on   Collection Time: 04/22/16  8:52 AM  Result Value Ref Range   Squamous Epithelial / LPF 0-5 (A) NONE SEEN   WBC, UA 6-30 0 - 5 WBC/hpf   RBC / HPF NONE SEEN 0 -  5 RBC/hpf   Bacteria, UA RARE (A) NONE SEEN    Imaging Studies:  No results found.  Assessment: Julia Mayer is  25 y.o. [phone] at [redacted]w[redacted]d presents with Abdominal Pain . MDM U/A- negative Wet prep- negative  Plan: Constipation- advised staying hydrated, drinking plenty of fluids Prescription for Miralax given Recommended fiber supplements Follow up with outpatient OB as scheduled  Steve Rattler, DO PGY-1 10/16/20179:33 AM The patient was seen and examined by me also Agree with  note NST reactive and reassuring UCs as listed Cervical exams as listed in note Ready for discharge Will have her followup in clinic as scheduled Seabron Spates, CNM

## 2016-04-22 NOTE — MAU Note (Signed)
States she woke up around 0500 with pain in lower abdomen. States she tried to "poop" and just a little bit came out. Thought it may be gas. States she has a bowel movement about every other day. States she has vaginal itching as well X 2 days. States vaginal discharge appears normal.

## 2016-05-15 ENCOUNTER — Encounter: Payer: Self-pay | Admitting: Certified Nurse Midwife

## 2016-05-15 ENCOUNTER — Ambulatory Visit (INDEPENDENT_AMBULATORY_CARE_PROVIDER_SITE_OTHER): Payer: Medicaid Other | Admitting: Certified Nurse Midwife

## 2016-05-15 DIAGNOSIS — Z3490 Encounter for supervision of normal pregnancy, unspecified, unspecified trimester: Secondary | ICD-10-CM

## 2016-05-15 DIAGNOSIS — Z3481 Encounter for supervision of other normal pregnancy, first trimester: Secondary | ICD-10-CM | POA: Diagnosis not present

## 2016-05-15 DIAGNOSIS — Z3493 Encounter for supervision of normal pregnancy, unspecified, third trimester: Secondary | ICD-10-CM

## 2016-05-15 NOTE — Progress Notes (Signed)
Subjective:    Julia Mayer is being seen today for her first obstetrical visit.  This is not a planned pregnancy. She is at [redacted]w[redacted]d gestation. Her obstetrical history is significant for none. Relationship with FOB: ex boyfriend, not currently involved. Patient does intend to breast feed. Pregnancy history fully reviewed.  Employed: as a Estate agent.    The information documented in the HPI was reviewed and verified.  Menstrual History: OB History    Gravida Para Term Preterm AB Living   5 1 1  0 3 1   SAB TAB Ectopic Multiple Live Births   0 3 0 0 1     6#12oz, term, vaginal delivery  Patient's last menstrual period was 10/19/2015.    Past Medical History:  Diagnosis Date  . Chlamydia   . No pertinent past medical history   . Normal pregnancy 07/19/2011  . SVD (spontaneous vaginal delivery) 07/20/2011  . Urinary tract infection during pregnancy 06/15/2011  . UTI (urinary tract infection)     Past Surgical History:  Procedure Laterality Date  . 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   . INDUCED ABORTION       (Not in a hospital admission) No Known Allergies  Social History  Substance Use Topics  . Smoking status: Never Smoker  . Smokeless tobacco: Never Used  . Alcohol use No     Comment: occasionally but not during pregnancy    Family History  Problem Relation Age of Onset  . Hypertension Mother   . Asthma Mother   . Alcohol abuse Mother   . Hypertension Father   . Hyperlipidemia Father   . Cancer Maternal Grandmother   . Cancer Paternal Grandmother   . Anesthesia problems Paternal Grandmother      Review of Systems Constitutional: negative for weight loss Gastrointestinal: negative for vomiting Genitourinary:negative for genital lesions and vaginal discharge and dysuria Musculoskeletal:negative for back pain Behavioral/Psych: negative for abusive relationship, depression, illegal drug usage and tobacco use    Objective:    BP 101/64   Pulse (!) 105   Wt 198  lb (89.8 kg)   LMP 10/19/2015   BMI 36.21 kg/m  General Appearance:    Alert, cooperative, no distress, appears stated age  Head:    Normocephalic, without obvious abnormality, atraumatic  Eyes:    PERRL, conjunctiva/corneas clear, EOM's intact, fundi    benign, both eyes  Ears:    Normal TM's and external ear canals, both ears  Nose:   Nares normal, septum midline, mucosa normal, no drainage    or sinus tenderness  Throat:   Lips, mucosa, and tongue normal; teeth and gums normal  Neck:   Supple, symmetrical, trachea midline, no adenopathy;    thyroid:  no enlargement/tenderness/nodules; no carotid   bruit or JVD  Back:     Symmetric, no curvature, ROM normal, no CVA tenderness  Lungs:     Clear to auscultation bilaterally, respirations unlabored  Chest Wall:    No tenderness or deformity   Heart:    Regular rate and rhythm, S1 and S2 normal, no murmur, rub   or gallop  Breast Exam:    No tenderness, masses, or nipple abnormality  Abdomen:     Soft, non-tender, bowel sounds active all four quadrants,    no masses, no organomegaly  Genitalia:    Normal female without lesion, discharge or tenderness  Extremities:   Extremities normal, atraumatic, no cyanosis or edema  Pulses:  2+ and symmetric all extremities  Skin:   Skin color, texture, turgor normal, no rashes or lesions  Lymph nodes:   Cervical, supraclavicular, and axillary nodes normal  Neurologic:   CNII-XII intact, normal strength, sensation and reflexes    throughout       FHR: 155,  FH: 30 cm.   Lab Review Urine pregnancy test Labs reviewed yes Radiologic studies reviewed yes Assessment:    Pregnancy at [redacted]w[redacted]d weeks    Plan:      Prenatal vitamins.  Counseling provided regarding continued use of seat belts, cessation of alcohol consumption, smoking or use of illicit drugs; infection precautions i.e., influenza/TDAP immunizations, toxoplasmosis,CMV, parvovirus, listeria and varicella; workplace safety, exercise  during pregnancy; routine dental care, safe medications, sexual activity, hot tubs, saunas, pools, travel, caffeine use, fish and methlymercury, potential toxins, hair treatments, varicose veins Weight gain recommendations per IOM guidelines reviewed: underweight/BMI< 18.5--> gain 28 - 40 lbs; normal weight/BMI 18.5 - 24.9--> gain 25 - 35 lbs; overweight/BMI 25 - 29.9--> gain 15 - 25 lbs; obese/BMI >30->gain  11 - 20 lbs Problem list reviewed and updated. FIRST/CF mutation testing/NIPT/QUAD SCREEN/fragile X/Ashkenazi Jewish population testing/Spinal muscular atrophy discussed: done at another office. Role of ultrasound in pregnancy discussed; fetal survey: results reviewed. Amniocentesis discussed: not indicated. VBAC calculator score: VBAC consent form provided No orders of the defined types were placed in this encounter.  No orders of the defined types were placed in this encounter.   Follow up in 2 weeks. 50% of 30 min visit spent on counseling and coordination of care.

## 2016-05-15 NOTE — Progress Notes (Signed)
Pt is a transfer from [place].  Pt has records with her and some in Epic. Pt states that she has had prenatal labs/testing but needs glucose testing.

## 2016-05-16 ENCOUNTER — Encounter: Payer: Self-pay | Admitting: Certified Nurse Midwife

## 2016-05-22 ENCOUNTER — Other Ambulatory Visit: Payer: Medicaid Other

## 2016-05-22 DIAGNOSIS — Z3493 Encounter for supervision of normal pregnancy, unspecified, third trimester: Secondary | ICD-10-CM

## 2016-05-23 ENCOUNTER — Other Ambulatory Visit: Payer: Self-pay | Admitting: Certified Nurse Midwife

## 2016-05-23 DIAGNOSIS — Z3480 Encounter for supervision of other normal pregnancy, unspecified trimester: Secondary | ICD-10-CM

## 2016-05-23 LAB — GLUCOSE TOLERANCE, 2 HOURS W/ 1HR
GLUCOSE, 2 HOUR: 122 mg/dL (ref 65–152)
Glucose, 1 hour: 142 mg/dL (ref 65–179)
Glucose, Fasting: 79 mg/dL (ref 65–91)

## 2016-05-23 LAB — CBC
HEMATOCRIT: 35.7 % (ref 34.0–46.6)
HEMOGLOBIN: 11.3 g/dL (ref 11.1–15.9)
MCH: 27.9 pg (ref 26.6–33.0)
MCHC: 31.7 g/dL (ref 31.5–35.7)
MCV: 88 fL (ref 79–97)
Platelets: 253 10*3/uL (ref 150–379)
RBC: 4.05 x10E6/uL (ref 3.77–5.28)
RDW: 13.7 % (ref 12.3–15.4)
WBC: 10.8 10*3/uL (ref 3.4–10.8)

## 2016-05-23 LAB — HIV ANTIBODY (ROUTINE TESTING W REFLEX): HIV Screen 4th Generation wRfx: NONREACTIVE

## 2016-05-23 LAB — RPR: RPR: NONREACTIVE

## 2016-06-05 ENCOUNTER — Ambulatory Visit (INDEPENDENT_AMBULATORY_CARE_PROVIDER_SITE_OTHER): Payer: Medicaid Other | Admitting: Certified Nurse Midwife

## 2016-06-05 DIAGNOSIS — Z3490 Encounter for supervision of normal pregnancy, unspecified, unspecified trimester: Secondary | ICD-10-CM

## 2016-06-05 DIAGNOSIS — Z3493 Encounter for supervision of normal pregnancy, unspecified, third trimester: Secondary | ICD-10-CM

## 2016-06-05 NOTE — Progress Notes (Signed)
Flu and Tdap discussed and offered, information given. Pt unsure if she would like vaccines today.

## 2016-06-05 NOTE — Progress Notes (Signed)
Subjective:    Julia Mayer is a 25 y.o. female being seen today for her obstetrical visit. She is at [redacted]w[redacted]d gestation. Patient reports backache, no bleeding, no contractions, no cramping and no leaking. Fetal movement: normal.  Child is sick currently.    Problem List Items Addressed This Visit      Other   Supervision of normal pregnancy, antepartum     Patient Active Problem List   Diagnosis Date Noted  . Supervision of normal pregnancy, antepartum 05/15/2016  . Candida vaginitis 01/28/2011   Objective:    BP 99/65   Pulse 81   Wt 201 lb (91.2 kg)   LMP 10/19/2015   BMI 36.76 kg/m  FHT:  158 BPM  Uterine Size: 33 cm and size equals dates  Presentation: cephalic     Assessment:    Pregnancy @ [redacted]w[redacted]d weeks   Lumbar back pain of pregnancy  Plan:     labs reviewed, problem list updated Consent signed. GBS sent TDAP offered  Rhogam given for RH negative Pediatrician: discussed. Infant feeding: plans to breastfeed. Maternity leave: discussed. Cigarette smoking: never smoked. No orders of the defined types were placed in this encounter.  No orders of the defined types were placed in this encounter.  Follow up in 2 Weeks.

## 2016-06-05 NOTE — Progress Notes (Signed)
Pt is having back pain and pelvic pressure. Flu and Tdap offered, pt unsure if she wants today.

## 2016-06-05 NOTE — Patient Instructions (Addendum)
Third Trimester of Pregnancy The third trimester is from week 29 through week 40 (months 7 through 9). The third trimester is a time when the unborn baby (fetus) is growing rapidly. At the end of the ninth month, the fetus is about 20 inches in length and weighs 6-10 pounds. Body changes during your third trimester Your body goes through many changes during pregnancy. The changes vary from woman to woman. During the third trimester:  Your weight will continue to increase. You can expect to gain 25-35 pounds (11-16 kg) by the end of the pregnancy.  You may begin to get stretch marks on your hips, abdomen, and breasts.  You may urinate more often because the fetus is moving lower into your pelvis and pressing on your bladder.  You may develop or continue to have heartburn. This is caused by increased hormones that slow down muscles in the digestive tract.  You may develop or continue to have constipation because increased hormones slow digestion and cause the muscles that push waste through your intestines to relax.  You may develop hemorrhoids. These are swollen veins (varicose veins) in the rectum that can itch or be painful.  You may develop swollen, bulging veins (varicose veins) in your legs.  You may have increased body aches in the pelvis, back, or thighs. This is due to weight gain and increased hormones that are relaxing your joints.  You may have changes in your hair. These can include thickening of your hair, rapid growth, and changes in texture. Some women also have hair loss during or after pregnancy, or hair that feels dry or thin. Your hair will most likely return to normal after your baby is born.  Your breasts will continue to grow and they will continue to become tender. A yellow fluid (colostrum) may leak from your breasts. This is the first milk you are producing for your baby.  Your belly button may stick out.  You may notice more swelling in your hands, face, or  ankles.  You may have increased tingling or numbness in your hands, arms, and legs. The skin on your belly may also feel numb.  You may feel short of breath because of your expanding uterus.  You may have more problems sleeping. This can be caused by the size of your belly, increased need to urinate, and an increase in your body's metabolism.  You may notice the fetus "dropping," or moving lower in your abdomen.  You may have increased vaginal discharge.  Your cervix becomes thin and soft (effaced) near your due date. What to expect at prenatal visits You will have prenatal exams every 2 weeks until week 36. Then you will have weekly prenatal exams. During a routine prenatal visit:  You will be weighed to make sure you and the fetus are growing normally.  Your blood pressure will be taken.  Your abdomen will be measured to track your baby's growth.  The fetal heartbeat will be listened to.  Any test results from the previous visit will be discussed.  You may have a cervical check near your due date to see if you have effaced. At around 36 weeks, your health care provider will check your cervix. At the same time, your health care provider will also perform a test on the secretions of the vaginal tissue. This test is to determine if a type of bacteria, Group B streptococcus, is present. Your health care provider will explain this further. Your health care provider may ask you:    What your birth plan is.  How you are feeling.  If you are feeling the baby move.  If you have had any abnormal symptoms, such as leaking fluid, bleeding, severe headaches, or abdominal cramping.  If you are using any tobacco products, including cigarettes, chewing tobacco, and electronic cigarettes.  If you have any questions. Other tests or screenings that may be performed during your third trimester include:  Blood tests that check for low iron levels (anemia).  Fetal testing to check the health,  activity level, and growth of the fetus. Testing is done if you have certain medical conditions or if there are problems during the pregnancy.  Nonstress test (NST). This test checks the health of your baby to make sure there are no signs of problems, such as the baby not getting enough oxygen. During this test, a belt is placed around your belly. The baby is made to move, and its heart rate is monitored during movement. What is false labor? False labor is a condition in which you feel small, irregular tightenings of the muscles in the womb (contractions) that eventually go away. These are called Braxton Hicks contractions. Contractions may last for hours, days, or even weeks before true labor sets in. If contractions come at regular intervals, become more frequent, increase in intensity, or become painful, you should see your health care provider. What are the signs of labor?  Abdominal cramps.  Regular contractions that start at 10 minutes apart and become stronger and more frequent with time.  Contractions that start on the top of the uterus and spread down to the lower abdomen and back.  Increased pelvic pressure and dull back pain.  A watery or bloody mucus discharge that comes from the vagina.  Leaking of amniotic fluid. This is also known as your "water breaking." It could be a slow trickle or a gush. Let your doctor know if it has a color or strange odor. If you have any of these signs, call your health care provider right away, even if it is before your due date. Follow these instructions at home: Eating and drinking  Continue to eat regular, healthy meals.  Do not eat:  Raw meat or meat spreads.  Unpasteurized milk or cheese.  Unpasteurized juice.  Store-made salad.  Refrigerated smoked seafood.  Hot dogs or deli meat, unless they are piping hot.  More than 6 ounces of albacore tuna a week.  Shark, swordfish, king mackerel, or tile fish.  Store-made salads.  Raw  sprouts, such as mung bean or alfalfa sprouts.  Take prenatal vitamins as told by your health care provider.  Take 1000 mg of calcium daily as told by your health care provider.  If you develop constipation:  Take over-the-counter or prescription medicines.  Drink enough fluid to keep your urine clear or pale yellow.  Eat foods that are high in fiber, such as fresh fruits and vegetables, whole grains, and beans.  Limit foods that are high in fat and processed sugars, such as fried and sweet foods. Activity  Exercise only as directed by your health care provider. Healthy pregnant women should aim for 2 hours and 30 minutes of moderate exercise per week. If you experience any pain or discomfort while exercising, stop.  Avoid heavy lifting.  Do not exercise in extreme heat or humidity, or at high altitudes.  Wear low-heel, comfortable shoes.  Practice good posture.  Do not travel far distances unless it is absolutely necessary and only with the approval   of your health care provider.  Wear your seat belt at all times while in a car, on a bus, or on a plane.  Take frequent breaks and rest with your legs elevated if you have leg cramps or low back pain.  Do not use hot tubs, steam rooms, or saunas.  You may continue to have sex unless your health care provider tells you otherwise. Lifestyle  Do not use any products that contain nicotine or tobacco, such as cigarettes and e-cigarettes. If you need help quitting, ask your health care provider.  Do not drink alcohol.  Do not use any medicinal herbs or unprescribed drugs. These chemicals affect the formation and growth of the baby.  If you develop varicose veins:  Wear support pantyhose or compression stockings as told by your healthcare provider.  Elevate your feet for 15 minutes, 3-4 times a day.  Wear a supportive maternity bra to help with breast tenderness. General instructions  Take over-the-counter and prescription  medicines only as told by your health care provider. There are medicines that are either safe or unsafe to take during pregnancy.  Take warm sitz baths to soothe any pain or discomfort caused by hemorrhoids. Use hemorrhoid cream or witch hazel if your health care provider approves.  Avoid cat litter boxes and soil used by cats. These carry germs that can cause birth defects in the baby. If you have a cat, ask someone to clean the litter box for you.  To prepare for the arrival of your baby:  Take prenatal classes to understand, practice, and ask questions about the labor and delivery.  Make a trial run to the hospital.  Visit the hospital and tour the maternity area.  Arrange for maternity or paternity leave through employers.  Arrange for family and friends to take care of pets while you are in the hospital.  Purchase a rear-facing car seat and make sure you know how to install it in your car.  Pack your hospital bag.  Prepare the baby's nursery. Make sure to remove all pillows and stuffed animals from the baby's crib to prevent suffocation.  Visit your dentist if you have not gone during your pregnancy. Use a soft toothbrush to brush your teeth and be gentle when you floss.  Keep all prenatal follow-up visits as told by your health care provider. This is important. Contact a health care provider if:  You are unsure if you are in labor or if your water has broken.  You become dizzy.  You have mild pelvic cramps, pelvic pressure, or nagging pain in your abdominal area.  You have lower back pain.  You have persistent nausea, vomiting, or diarrhea.  You have an unusual or bad smelling vaginal discharge.  You have pain when you urinate. Get help right away if:  You have a fever.  You are leaking fluid from your vagina.  You have spotting or bleeding from your vagina.  You have severe abdominal pain or cramping.  You have rapid weight loss or weight gain.  You have  shortness of breath with chest pain.  You notice sudden or extreme swelling of your face, hands, ankles, feet, or legs.  Your baby makes fewer than 10 movements in 2 hours.  You have severe headaches that do not go away with medicine.  You have vision changes. Summary  The third trimester is from week 29 through week 40, months 7 through 9. The third trimester is a time when the unborn baby (fetus)   is growing rapidly.  During the third trimester, your discomfort may increase as you and your baby continue to gain weight. You may have abdominal, leg, and back pain, sleeping problems, and an increased need to urinate.  During the third trimester your breasts will keep growing and they will continue to become tender. A yellow fluid (colostrum) may leak from your breasts. This is the first milk you are producing for your baby.  False labor is a condition in which you feel small, irregular tightenings of the muscles in the womb (contractions) that eventually go away. These are called Braxton Hicks contractions. Contractions may last for hours, days, or even weeks before true labor sets in.  Signs of labor can include: abdominal cramps; regular contractions that start at 10 minutes apart and become stronger and more frequent with time; watery or bloody mucus discharge that comes from the vagina; increased pelvic pressure and dull back pain; and leaking of amniotic fluid. This information is not intended to replace advice given to you by your health care provider. Make sure you discuss any questions you have with your health care provider. Document Released: 06/18/2001 Document Revised: 11/30/2015 Document Reviewed: 08/25/2012 Elsevier Interactive Patient Education  2017 [employer]  

## 2016-06-17 ENCOUNTER — Inpatient Hospital Stay (HOSPITAL_COMMUNITY)
Admission: AD | Admit: 2016-06-17 | Discharge: 2016-06-17 | Disposition: A | Discharge status: Home / Self Care | Payer: Medicaid Other | Source: Ambulatory Visit | Attending: Family Medicine | Admitting: Family Medicine

## 2016-06-17 ENCOUNTER — Encounter (HOSPITAL_COMMUNITY): Payer: Self-pay

## 2016-06-17 DIAGNOSIS — Z809 Family history of malignant neoplasm, unspecified: Secondary | ICD-10-CM | POA: Insufficient documentation

## 2016-06-17 DIAGNOSIS — Z8249 Family history of ischemic heart disease and other diseases of the circulatory system: Secondary | ICD-10-CM | POA: Insufficient documentation

## 2016-06-17 DIAGNOSIS — M545 Low back pain: Secondary | ICD-10-CM | POA: Diagnosis not present

## 2016-06-17 DIAGNOSIS — M549 Dorsalgia, unspecified: Secondary | ICD-10-CM

## 2016-06-17 DIAGNOSIS — Z811 Family history of alcohol abuse and dependence: Secondary | ICD-10-CM | POA: Diagnosis not present

## 2016-06-17 DIAGNOSIS — Z3A34 34 weeks gestation of pregnancy: Secondary | ICD-10-CM | POA: Insufficient documentation

## 2016-06-17 DIAGNOSIS — O26893 Other specified pregnancy related conditions, third trimester: Secondary | ICD-10-CM | POA: Insufficient documentation

## 2016-06-17 DIAGNOSIS — Z79899 Other long term (current) drug therapy: Secondary | ICD-10-CM | POA: Insufficient documentation

## 2016-06-17 DIAGNOSIS — Z9889 Other specified postprocedural states: Secondary | ICD-10-CM | POA: Diagnosis not present

## 2016-06-17 DIAGNOSIS — G5702 Lesion of sciatic nerve, left lower limb: Secondary | ICD-10-CM

## 2016-06-17 DIAGNOSIS — O9989 Other specified diseases and conditions complicating pregnancy, childbirth and the puerperium: Secondary | ICD-10-CM

## 2016-06-17 LAB — URINALYSIS, MICROSCOPIC (REFLEX)

## 2016-06-17 LAB — URINALYSIS, ROUTINE W REFLEX MICROSCOPIC
BILIRUBIN URINE: NEGATIVE
Glucose, UA: NEGATIVE mg/dL
Hgb urine dipstick: NEGATIVE
KETONES UR: NEGATIVE mg/dL
NITRITE: NEGATIVE
PROTEIN: NEGATIVE mg/dL
Specific Gravity, Urine: 1.013 (ref 1.005–1.030)
pH: 6 (ref 5.0–8.0)

## 2016-06-17 MED ORDER — CYCLOBENZAPRINE HCL 10 MG PO TABS
10.0000 mg | ORAL_TABLET | Freq: Once | ORAL | Status: AC
  Administered 2016-06-17: 10 mg via ORAL
  Filled 2016-06-17: qty 1

## 2016-06-17 NOTE — MAU Provider Note (Signed)
History     CSN: [id number]  Arrival date and time: 06/17/16 [id number]    Chief Complaint  Patient presents with  . Back Pain   25 yo AAF [id number] at [facility] dated by LMP presenting with several day hx of left buttock pain and right sided low back pain that has been unchanged since onset. Denies prior episodes in current pregnancy. Denies hx of kidney stones. Denies n/v/flank pain, dysuria, hematuria, loss of appetite, headaches, chest pain, abdominal pain. Describes pain has pressure-like and achy. Rated 7/10. Has not taken any OTC meds for pain. 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 Has maintained good oral hydration and nutrition. Denies vb/ lof/.Reports good fetal movement.    OB History    Gravida Para Term Preterm AB Living   5 1 1  0 3 1   SAB TAB Ectopic Multiple Live Births   0 3 0 0 1      Past Medical History:  Diagnosis Date  . Chlamydia   . No pertinent past medical history   . Normal pregnancy 07/19/2011  . SVD (spontaneous vaginal delivery) 07/20/2011  . Urinary tract infection during pregnancy 06/15/2011  . UTI (urinary tract infection)     Past Surgical History:  Procedure Laterality Date  . 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   . INDUCED ABORTION      Family History  Problem Relation Age of Onset  . Hypertension Mother   . Asthma Mother   . Alcohol abuse Mother   . Hypertension Father   . Hyperlipidemia Father   . Cancer Maternal Grandmother   . Cancer Paternal Grandmother   . Anesthesia problems Paternal Grandmother     Social History  Substance Use Topics  . Smoking status: Never Smoker  . Smokeless tobacco: Never Used  . Alcohol use No     Comment: occasionally but not during pregnancy    Allergies: No Known Allergies  Prescriptions Prior to Admission  Medication Sig Dispense Refill Last Dose  . polyethylene glycol (MIRALAX / GLYCOLAX) packet Take 17 g by mouth daily. 14 each 3   . Prenatal Vit-Fe Fumarate-FA (PRENATAL  MULTIVITAMIN) TABS Take 1 tablet by mouth daily.     Taking    Review of Systems  Constitutional: Negative.   Respiratory: Negative.   Cardiovascular: Negative.   Gastrointestinal: Negative.   Genitourinary: Negative.   Musculoskeletal: Positive for back pain.  Skin: Negative.   A comprehensive ROS was negative except per HPI or above.     Physical Exam   Blood pressure 115/73, pulse 110, temperature 98.1 F (36.7 C), temperature source Oral, resp. rate 18, last menstrual period 10/19/2015.  Physical Exam  Constitutional: She is oriented to person, place, and time. She appears well-developed and well-nourished.  Neck: Normal range of motion.  Respiratory: Effort normal.  Musculoskeletal: She exhibits tenderness.  Neurological: She is alert and oriented to person, place, and time.  Skin: Skin is warm.  Psychiatric: She has a normal mood and affect.    MAU Course  Procedures  MDM -UA-negative -Flexeril 10mg  x 1 given  Assessment and Plan  1) MSK Pain in pregnancy -Counseled on home exercises to alleviate pain. Close follow up with OB provider in [facility] office.  WALLACE, NOAH I 06/17/2016, 10:05 AM    OB FELLOW MAU DISCHARGE ATTESTATION  I have seen and examined this patient; I agree with above documentation in the resident's note. Patient has  more musculoskeletal pain. Likely piriformis syndrome of left buttock, does not radiate down leg. No numbness/tingling. Feels like a "hot poker" stabbing left buttock. Also associated with right lower back pain. Discussed using pillows to position between legs when laying down and sleeping, and stretches to do. No relief from flexeril today. Preterm labor highly unlikely (no contractions, cervix is fingertip/thick- I personally checked the patient). OK for discharge home.   Katherine Basset, DO OB Fellow

## 2016-06-17 NOTE — MAU Note (Signed)
Pt presets to MAU with pain in the "left side of by butt and right side of back." Pain began over the weekend. Denies taking any meds for the pain. Denies bleeding or leaking of fluid. Reports good fetal movement. Pt "just wants to make sure everything is ok"

## 2016-06-17 NOTE — Discharge Instructions (Signed)
Back Pain in Pregnancy Introduction Back pain during pregnancy is common. Back pain may be caused by several factors that are related to changes during your pregnancy. Follow these instructions at home: Managing pain, stiffness, and swelling  If directed, apply ice for sudden (acute) back pain.  Put ice in a plastic bag.  Place a towel between your skin and the bag.  Leave the ice on for 20 minutes, 2-3 times per day.  If directed, apply heat to the affected area before you exercise:  Place a towel between your skin and the heat pack or heating pad.  Leave the heat on for 20-30 minutes.  Remove the heat if your skin turns bright red. This is especially important if you are unable to feel pain, heat, or cold. You may have a greater risk of getting burned. Activity  Exercise as told by your health care provider. Exercising is the best way to prevent or manage back pain.  Listen to your body when lifting. If lifting hurts, ask for help or bend your knees. This uses your leg muscles instead of your back muscles.  Squat down when picking up something from the floor. Do not bend over.  Only use bed rest as told by your health care provider. Bed rest should only be used for the most severe episodes of back pain. Standing, Sitting, and Lying Down  Do not stand in one place for long periods of time.  Use good posture when sitting. Make sure your head rests over your shoulders and is not hanging forward. Use a pillow on your lower back if necessary.  Try sleeping on your side, preferably the left side, with a pillow or two between your legs. If you are sore after a night's rest, your bed may be too soft. A firm mattress may provide more support for your back during pregnancy. General instructions  Do not wear high heels.  Eat a healthy diet. Try to gain weight within your health care provider's recommendations.  Use a maternity girdle, elastic sling, or back brace as told by your  health care provider.  Take over-the-counter and prescription medicines only as told by your health care provider.  Keep all follow-up visits as told by your health care provider. This is important. This includes any visits with any specialists, such as a physical therapist. Contact a health care provider if:  Your back pain interferes with your daily activities.  You have increasing pain in other parts of your body. Get help right away if:  You develop numbness, tingling, weakness, or problems with the use of your arms or legs.  You develop severe back pain that is not controlled with medicine.  You have a sudden change in bowel or bladder control.  You develop shortness of breath, dizziness, or you faint.  You develop nausea, vomiting, or sweating.  You have back pain that is a rhythmic, cramping pain similar to labor pains. Labor pain is usually 1-2 minutes apart, lasts for about 1 minute, and involves a bearing down feeling or pressure in your pelvis.  You have back pain and your water breaks or you have vaginal bleeding.  You have back pain or numbness that travels down your leg.  Your back pain developed after you fell.  You develop pain on one side of your back.  You see blood in your urine.  You develop skin blisters in the area of your back pain. This information is not intended to replace advice given to  you by your health care provider. Make sure you discuss any questions you have with your health care provider. Document Released: 10/02/2005 Document Revised: 11/30/2015 Document Reviewed: 03/08/2015  2017 Elsevier  Round Ligament Pain Introduction The round ligament is a cord of muscle and tissue that helps to support the uterus. It can become a source of pain during pregnancy if it becomes stretched or twisted as the baby grows. The pain usually begins in the second trimester of pregnancy, and it can come and go until the baby is delivered. It is not a serious  problem, and it does not cause harm to the baby. Round ligament pain is usually a short, sharp, and pinching pain, but it can also be a dull, lingering, and aching pain. The pain is felt in the lower side of the abdomen or in the groin. It usually starts deep in the groin and moves up to the outside of the hip area. Pain can occur with:  A sudden change in position.  Rolling over in bed.  Coughing or sneezing.  Physical activity. Follow these instructions at home: Watch your condition for any changes. Take these steps to help with your pain:  When the pain starts, relax. Then try:  Sitting down.  Flexing your knees up to your abdomen.  Lying on your side with one pillow under your abdomen and another pillow between your legs.  Sitting in a warm bath for 15-20 minutes or until the pain goes away.  Take over-the-counter and prescription medicines only as told by your health care provider.  Move slowly when you sit and stand.  Avoid long walks if they cause pain.  Stop or lessen your physical activities if they cause pain. Contact a health care provider if:  Your pain does not go away with treatment.  You feel pain in your back that you did not have before.  Your medicine is not helping. Get help right away if:  You develop a fever or chills.  You develop uterine contractions.  You develop vaginal bleeding.  You develop nausea or vomiting.  You develop diarrhea.  You have pain when you urinate. This information is not intended to replace advice given to you by your health care provider. Make sure you discuss any questions you have with your health care provider. Document Released: 04/02/2008 Document Revised: 11/30/2015 Document Reviewed: 08/31/2014  2017 Elsevier  Stretching: Piriformis    Cross left leg over other thigh and place elbow over outside of knee. Gently stretch buttock muscles by pushing bent knee across body. Hold ____ seconds. Repeat ____ times  per set. Do ____ sets per session. Do ____ sessions per day.  http://orth.exer.us/651   Copyright  VHI. All rights reserved.

## 2016-06-18 LAB — CULTURE, OB URINE

## 2016-06-19 ENCOUNTER — Other Ambulatory Visit: Payer: Self-pay | Admitting: Obstetrics and Gynecology

## 2016-06-20 ENCOUNTER — Ambulatory Visit (INDEPENDENT_AMBULATORY_CARE_PROVIDER_SITE_OTHER): Payer: Medicaid Other | Admitting: Obstetrics

## 2016-06-20 VITALS — BP 119/83 | HR 81 | Temp 97.4°F | Wt 204.2 lb

## 2016-06-20 DIAGNOSIS — Z3480 Encounter for supervision of other normal pregnancy, unspecified trimester: Secondary | ICD-10-CM

## 2016-06-20 DIAGNOSIS — Z3493 Encounter for supervision of normal pregnancy, unspecified, third trimester: Secondary | ICD-10-CM

## 2016-06-22 LAB — STREP GP B NAA: STREP GROUP B AG: NEGATIVE

## 2016-06-23 ENCOUNTER — Encounter: Payer: Self-pay | Admitting: Obstetrics

## 2016-06-23 NOTE — Progress Notes (Signed)
Subjective:    Julia Mayer is a 25 y.o. female being seen today for her obstetrical visit. She is at [redacted]w[redacted]d gestation. Patient reports no complaints. Fetal movement: normal.  Problem List Items Addressed This Visit    None    Visit Diagnoses    Encounter for supervision of normal pregnancy in third trimester, unspecified gravidity    -  Primary   Relevant Orders   Strep Gp B NAA (Completed)     Patient Active Problem List   Diagnosis Date Noted  . Supervision of normal pregnancy, antepartum 05/15/2016  . Candida vaginitis 01/28/2011   Objective:    BP 119/83   Pulse 81   Temp 97.4 F (36.3 C)   Wt 204 lb 3.2 oz (92.6 kg)   LMP 10/19/2015   BMI 37.35 kg/m  FHT:  150 BPM  Uterine Size: size equals dates  Presentation: unsure     Assessment:    Pregnancy @ [redacted]w[redacted]d weeks   Plan:     labs reviewed, problem list updated Consent signed. GBS sent TDAP offered  Rhogam given for RH negative Pediatrician: discussed. Infant feeding: plans to breastfeed. Maternity leave: discussed. Cigarette smoking: never smoked. Orders Placed This Encounter  Procedures  . Strep Gp B NAA   No orders of the defined types were placed in this encounter.  Follow up in 1 Week.

## 2016-06-27 ENCOUNTER — Ambulatory Visit (INDEPENDENT_AMBULATORY_CARE_PROVIDER_SITE_OTHER): Payer: Medicaid Other | Admitting: Family Medicine

## 2016-06-27 VITALS — BP 109/75 | HR 74 | Wt 205.0 lb

## 2016-06-27 DIAGNOSIS — Z3493 Encounter for supervision of normal pregnancy, unspecified, third trimester: Secondary | ICD-10-CM

## 2016-06-27 MED ORDER — TETANUS-DIPHTH-ACELL PERTUSSIS 5-2.5-18.5 LF-MCG/0.5 IM SUSP
0.5000 mL | Freq: Once | INTRAMUSCULAR | Status: AC
  Administered 2016-06-27: 0.5 mL via INTRAMUSCULAR

## 2016-06-27 NOTE — Progress Notes (Signed)
   PRENATAL VISIT NOTE  Subjective:  Julia Mayer is a 25 y.o. G5P1031 at [redacted]w[redacted]d being seen today for ongoing prenatal care.  She is currently monitored for the following issues for this low-risk pregnancy and has Candida vaginitis and Supervision of normal pregnancy, antepartum on her problem list.  Patient reports occasional contractions.  Contractions: Irregular. Vag. Bleeding: None.  Movement: Present. Denies leaking of fluid.   The following portions of the patient's history were reviewed and updated as appropriate: allergies, current medications, past family history, past medical history, past social history, past surgical history and problem list. Problem list updated.  Objective:   Vitals:   06/27/16 1433  BP: 109/75  Pulse: 74  Weight: 205 lb (93 kg)    Fetal Status: Fetal Heart Rate (bpm): 136 Fundal Height: 36 cm Movement: Present     General:  Alert, oriented and cooperative. Patient is in no acute distress.  Skin: Skin is warm and dry. No rash noted.   Cardiovascular: Normal heart rate noted  Respiratory: Normal respiratory effort, no problems with respiration noted  Abdomen: Soft, gravid, appropriate for gestational age. Pain/Pressure: Present     Pelvic:  Cervical exam deferred        Extremities: Normal range of motion.     Mental Status: Normal mood and affect. Normal behavior. Normal judgment and thought content.   Assessment and Plan:  Pregnancy: [id number] at [redacted]w[redacted]d  1. Encounter for supervision of normal pregnancy in third trimester, unspecified gravidity FHT and FH normal.   Preterm labor symptoms and general obstetric precautions including but not limited to vaginal bleeding, contractions, leaking of fluid and fetal movement were reviewed in detail with the patient. Please refer to After Visit Summary for other counseling recommendations.  No Follow-up on file.   Truett Mainland, DO

## 2016-07-04 ENCOUNTER — Ambulatory Visit (INDEPENDENT_AMBULATORY_CARE_PROVIDER_SITE_OTHER): Payer: Medicaid Other | Admitting: Certified Nurse Midwife

## 2016-07-04 VITALS — BP 133/80 | HR 73 | Wt 209.0 lb

## 2016-07-04 DIAGNOSIS — O26843 Uterine size-date discrepancy, third trimester: Secondary | ICD-10-CM

## 2016-07-04 DIAGNOSIS — Z3480 Encounter for supervision of other normal pregnancy, unspecified trimester: Secondary | ICD-10-CM

## 2016-07-04 MED ORDER — OB COMPLETE PETITE 35-5-1-200 MG PO CAPS: 1.0000 | ORAL_CAPSULE | Freq: Every day | ORAL | 12 refills | Status: DC

## 2016-07-04 NOTE — Progress Notes (Signed)
Needs new PNV due to cost of current Rx. Pt states increase in heartburn, especially at night.

## 2016-07-04 NOTE — Progress Notes (Signed)
Subjective:    Julia Mayer is a 25 y.o. female being seen today for her obstetrical visit. She is at [redacted]w[redacted]d gestation. Patient reports no complaints. Fetal movement: normal.  Problem List Items Addressed This Visit      Other   Supervision of normal pregnancy, antepartum - Primary   Relevant Medications   Prenat-FeCbn-FeAspGl-FA-Omega (OB COMPLETE PETITE) 35-5-1-200 MG CAPS   Other Relevant Orders   [place] MFM OB COMP + 16 WK    Other Visit Diagnoses    Uterine size date discrepancy pregnancy, third trimester       Relevant Orders   [place] MFM OB COMP + 28 WK     Patient Active Problem List   Diagnosis Date Noted  . Supervision of normal pregnancy, antepartum 05/15/2016  . Candida vaginitis 01/28/2011    Objective:    BP 133/80   Pulse 73   Wt 209 lb (94.8 kg)   LMP 10/19/2015   BMI 38.23 kg/m  FHT: 135 BPM  Uterine Size: 40 cm and size greater than dates  Presentations: cephalic  Pelvic Exam: deferred     Assessment:    Pregnancy @ [redacted]w[redacted]d weeks   S>D, ? Polyhydramnios versus LGA  Plan:   Plans for delivery: Vaginal anticipated; labs reviewed; problem list updated Counseling: Consent signed. Infant feeding: plans to breastfeed. Cigarette smoking: never smoked. L&D discussion: symptoms of labor, discussed when to call, discussed what number to call, anesthetic/analgesic options reviewed and delivering clinician:  plans no preference. Postpartum supports and preparation: circumcision discussed and contraception plans discussed.  Follow up in 1 Week.

## 2016-07-08 NOTE — L&D Delivery Note (Signed)
Operative Delivery Note At 3:10 PM a viable female was delivered via Vaginal, Spontaneous Delivery.  Presentation: vertex; Position: LOA Station:  .  Delivery of the head: 07/25/2016  3:10 PM First maneuver: 07/25/2016  3:10 PM, McRoberts Second maneuver: 07/25/2016  3:10 PM, Suprapubic Pressure   APGAR: 3, 7; weight 7 lb 12.2 oz (3520 g).   Placenta status: , .  Normal intact sent to pathResults for GEORGEEN, HAWK (MRN [id number]) as of 07/25/2016 17:09  Ref. Range 07/25/2016 15:19  pH cord blood (arterial) Latest Ref Range: 7.210 - 7.380  7.099 (LL)  pCO2 cord blood (arterial) Latest Ref Range: 42.0 - 56.0 mmHg 69.0 (H)  Bicarbonate Latest Ref Range: 13.0 - 22.0 mmol/L 20.4   Cord:  with the following complications: .  Cord pH:   Anesthesia:  epidural Episiotomy: None Lacerations:  Second degree perineal Suture Repair: 3.0 Est. Blood Loss (mL): 200  Mom to postpartum.  Baby to Couplet care / Skin to Skin.  Emeterio Reeve 07/25/2016, 5:07 PM

## 2016-07-09 DIAGNOSIS — Z3482 Encounter for supervision of other normal pregnancy, second trimester: Secondary | ICD-10-CM

## 2016-07-11 ENCOUNTER — Encounter: Payer: Self-pay | Admitting: Obstetrics

## 2016-07-11 ENCOUNTER — Ambulatory Visit (INDEPENDENT_AMBULATORY_CARE_PROVIDER_SITE_OTHER): Payer: Medicaid Other | Admitting: Obstetrics

## 2016-07-11 VITALS — BP 117/80 | HR 81 | Wt 211.0 lb

## 2016-07-11 DIAGNOSIS — Z3480 Encounter for supervision of other normal pregnancy, unspecified trimester: Secondary | ICD-10-CM

## 2016-07-11 DIAGNOSIS — Z3490 Encounter for supervision of normal pregnancy, unspecified, unspecified trimester: Secondary | ICD-10-CM

## 2016-07-11 NOTE — Progress Notes (Signed)
Subjective:    Julia Mayer is a 26 y.o. female being seen today for her obstetrical visit. She is at [redacted]w[redacted]d gestation. Patient reports no complaints. Fetal movement: normal.  Problem List Items Addressed This Visit    Supervision of normal pregnancy, antepartum     Patient Active Problem List   Diagnosis Date Noted  . Supervision of normal pregnancy, antepartum 05/15/2016  . Candida vaginitis 01/28/2011    Objective:    BP 117/80   Pulse 81   Wt 211 lb (95.7 kg)   LMP 10/19/2015   BMI 38.59 kg/m  FHT: 140 BPM  Uterine Size: size greater than dates    Assessment:    Pregnancy @ [redacted]w[redacted]d weeks   Plan:   Plans for delivery: Vaginal anticipated; labs reviewed; problem list updated Counseling: Consent signed. Infant feeding: plans to breastfeed. Cigarette smoking: never smoked. L&D discussion: symptoms of labor, discussed when to call, discussed what number to call, anesthetic/analgesic options reviewed and delivering clinician:  plans no preference. Postpartum supports and preparation: circumcision discussed and contraception plans discussed.  Follow up in 1 Week.  Patient ID: Julia Mayer, female   DOB: September 17, 1990, 26 y.o.   MRN: [id number] Patient ID: Julia Mayer, female   DOB: 12/17/1990, 26 y.o.   MRN: [id number]

## 2016-07-12 ENCOUNTER — Ambulatory Visit (HOSPITAL_COMMUNITY)
Admission: RE | Admit: 2016-07-12 | Discharge: 2016-07-12 | Disposition: A | Discharge status: Home / Self Care | Payer: Medicaid Other | Source: Ambulatory Visit | Attending: Certified Nurse Midwife | Admitting: Certified Nurse Midwife

## 2016-07-12 ENCOUNTER — Inpatient Hospital Stay (HOSPITAL_COMMUNITY)
Admission: AD | Admit: 2016-07-12 | Discharge: 2016-07-12 | Disposition: A | Discharge status: Home / Self Care | Payer: Medicaid Other | Source: Ambulatory Visit | Attending: Obstetrics and Gynecology | Admitting: Obstetrics and Gynecology

## 2016-07-12 ENCOUNTER — Telehealth: Payer: Self-pay | Admitting: *Deleted

## 2016-07-12 ENCOUNTER — Encounter (HOSPITAL_COMMUNITY): Payer: Self-pay

## 2016-07-12 ENCOUNTER — Inpatient Hospital Stay (HOSPITAL_COMMUNITY)
Admission: AD | Admit: 2016-07-12 | Discharge: 2016-07-12 | Disposition: A | Discharge status: Home / Self Care | Payer: Medicaid Other | Source: Ambulatory Visit | Attending: Obstetrics & Gynecology | Admitting: Obstetrics & Gynecology

## 2016-07-12 DIAGNOSIS — O26843 Uterine size-date discrepancy, third trimester: Secondary | ICD-10-CM

## 2016-07-12 DIAGNOSIS — O479 False labor, unspecified: Secondary | ICD-10-CM

## 2016-07-12 DIAGNOSIS — Z9889 Other specified postprocedural states: Secondary | ICD-10-CM | POA: Insufficient documentation

## 2016-07-12 DIAGNOSIS — Z3A39 39 weeks gestation of pregnancy: Secondary | ICD-10-CM | POA: Insufficient documentation

## 2016-07-12 DIAGNOSIS — Z811 Family history of alcohol abuse and dependence: Secondary | ICD-10-CM | POA: Diagnosis not present

## 2016-07-12 DIAGNOSIS — Z3490 Encounter for supervision of normal pregnancy, unspecified, unspecified trimester: Secondary | ICD-10-CM

## 2016-07-12 DIAGNOSIS — O471 False labor at or after 37 completed weeks of gestation: Secondary | ICD-10-CM

## 2016-07-12 DIAGNOSIS — Z79899 Other long term (current) drug therapy: Secondary | ICD-10-CM | POA: Insufficient documentation

## 2016-07-12 DIAGNOSIS — Z3480 Encounter for supervision of other normal pregnancy, unspecified trimester: Secondary | ICD-10-CM

## 2016-07-12 DIAGNOSIS — Z8249 Family history of ischemic heart disease and other diseases of the circulatory system: Secondary | ICD-10-CM | POA: Diagnosis not present

## 2016-07-12 DIAGNOSIS — Z809 Family history of malignant neoplasm, unspecified: Secondary | ICD-10-CM | POA: Insufficient documentation

## 2016-07-12 DIAGNOSIS — O212 Late vomiting of pregnancy: Secondary | ICD-10-CM | POA: Diagnosis present

## 2016-07-12 DIAGNOSIS — Z369 Encounter for antenatal screening, unspecified: Secondary | ICD-10-CM | POA: Diagnosis not present

## 2016-07-12 DIAGNOSIS — Z3A38 38 weeks gestation of pregnancy: Secondary | ICD-10-CM | POA: Insufficient documentation

## 2016-07-12 LAB — URINALYSIS, ROUTINE W REFLEX MICROSCOPIC
BILIRUBIN URINE: NEGATIVE
Glucose, UA: NEGATIVE mg/dL
Ketones, ur: NEGATIVE mg/dL
NITRITE: NEGATIVE
PH: 7 (ref 5.0–8.0)
Protein, ur: NEGATIVE mg/dL
SPECIFIC GRAVITY, URINE: 1.009 (ref 1.005–1.030)

## 2016-07-12 NOTE — MAU Note (Signed)
This morning was having contractions, became really nauseated and threw up.  Contractions are irregular.  Denies diarrhea

## 2016-07-12 NOTE — Discharge Instructions (Signed)
Braxton Hicks Contractions °Contractions of the uterus can occur throughout pregnancy. Contractions are not always a sign that you are in labor.  °WHAT ARE BRAXTON HICKS CONTRACTIONS?  °Contractions that occur before labor are called Braxton Hicks contractions, or false labor. Toward the end of pregnancy (32-34 weeks), these contractions can develop more often and may become more forceful. This is not true labor because these contractions do not result in opening (dilatation) and thinning of the cervix. They are sometimes difficult to tell apart from true labor because these contractions can be forceful and people have different pain tolerances. You should not feel embarrassed if you go to the hospital with false labor. Sometimes, the only way to tell if you are in true labor is for your health care provider to look for changes in the cervix. °If there are no prenatal problems or other health problems associated with the pregnancy, it is completely safe to be sent home with false labor and await the onset of true labor. °HOW CAN YOU TELL THE DIFFERENCE BETWEEN TRUE AND FALSE LABOR? °False Labor  °· The contractions of false labor are usually shorter and not as hard as those of true labor.   °· The contractions are usually irregular.   °· The contractions are often felt in the front of the lower abdomen and in the groin.   °· The contractions may go away when you walk around or change positions while lying down.   °· The contractions get weaker and are shorter lasting as time goes on.   °· The contractions do not usually become progressively stronger, regular, and closer together as with true labor.   °True Labor  °· Contractions in true labor last 30-70 seconds, become very regular, usually become more intense, and increase in frequency.   °· The contractions do not go away with walking.   °· The discomfort is usually felt in the top of the uterus and spreads to the lower abdomen and low back.   °· True labor can be  determined by your health care provider with an exam. This will show that the cervix is dilating and getting thinner.   °WHAT TO REMEMBER °· Keep up with your usual exercises and follow other instructions given by your health care provider.   °· Take medicines as directed by your health care provider.   °· Keep your regular prenatal appointments.   °· Eat and drink lightly if you think you are going into labor.   °· If Braxton Hicks contractions are making you uncomfortable:   °¨ Change your position from lying down or resting to walking, or from walking to resting.   °¨ Sit and rest in a tub of warm water.   °¨ Drink 2-3 glasses of water. Dehydration may cause these contractions.   °¨ Do slow and deep breathing several times an hour.   °WHEN SHOULD I SEEK IMMEDIATE MEDICAL CARE? °Seek immediate medical care if: °· Your contractions become stronger, more regular, and closer together.   °· You have fluid leaking or gushing from your vagina.   °· You have a fever.   °· You pass blood-tinged mucus.   °· You have vaginal bleeding.   °· You have continuous abdominal pain.   °· You have low back pain that you never had before.   °· You feel your baby's head pushing down and causing pelvic pressure.   °· Your baby is not moving as much as it used to.   °This information is not intended to replace advice given to you by your health care provider. Make sure you discuss any questions you have with your health care   provider. °Document Released: 06/24/2005 Document Revised: 10/16/2015 Document Reviewed: 04/05/2013 °Elsevier Interactive Patient Education © 2017 Else[employer] Inc[employer] ° °

## 2016-07-12 NOTE — MAU Note (Signed)
Was seen here this am. Has been having pelvic pressure and ctxs all day. Was 2cm earlier today. Denies LOF or bleeding

## 2016-07-12 NOTE — Discharge Instructions (Signed)
Third Trimester of Pregnancy °The third trimester is from week 29 through week 42, months 7 through 9. This trimester is when your unborn baby (fetus) is growing very fast. At the end of the ninth month, the unborn baby is about 20 inches in length. It weighs about 6-10 pounds. °Follow these instructions at home: °· Avoid all smoking, herbs, and alcohol. Avoid drugs not approved by your doctor. °· Do not use any tobacco products, including cigarettes, chewing tobacco, and electronic cigarettes. If you need help quitting, ask your doctor. You may get counseling or other support to help you quit. °· Only take medicine as told by your doctor. Some medicines are safe and some are not during pregnancy. °· Exercise only as told by your doctor. Stop exercising if you start having cramps. °· Eat regular, healthy meals. °· Wear a good support bra if your breasts are tender. °· Do not use hot tubs, steam rooms, or saunas. °· Wear your seat belt when driving. °· Avoid raw meat, uncooked cheese, and liter boxes and soil used by cats. °· Take your prenatal vitamins. °· Take 1500-2000 milligrams of calcium daily starting at the 20th week of pregnancy until you deliver your baby. °· Try taking medicine that helps you poop (stool softener) as needed, and if your doctor approves. Eat more fiber by eating fresh fruit, vegetables, and whole grains. Drink enough fluids to keep your pee (urine) clear or pale yellow. °· Take warm water baths (sitz baths) to soothe pain or discomfort caused by hemorrhoids. Use hemorrhoid cream if your doctor approves. °· If you have puffy, bulging veins (varicose veins), wear support hose. Raise (elevate) your feet for 15 minutes, 3-4 times a day. Limit salt in your diet. °· Avoid heavy lifting, wear low heels, and sit up straight. °· Rest with your legs raised if you have leg cramps or low back pain. °· Visit your dentist if you have not gone during your pregnancy. Use a soft toothbrush to brush your  teeth. Be gentle when you floss. °· You can have sex (intercourse) unless your doctor tells you not to. °· Do not travel far distances unless you must. Only do so with your doctor's approval. °· Take prenatal classes. °· Practice driving to the hospital. °· Pack your hospital bag. °· Prepare the baby's room. °· Go to your doctor visits. °Get help if: °· You are not sure if you are in labor or if your water has broken. °· You are dizzy. °· You have mild cramps or pressure in your lower belly (abdominal). °· You have a nagging pain in your belly area. °· You continue to feel sick to your stomach (nauseous), throw up (vomit), or have watery poop (diarrhea). °· You have bad smelling fluid coming from your vagina. °· You have pain with peeing (urination). °Get help right away if: °· You have a fever. °· You are leaking fluid from your vagina. °· You are spotting or bleeding from your vagina. °· You have severe belly cramping or pain. °· You lose or gain weight rapidly. °· You have trouble catching your breath and have chest pain. °· You notice sudden or extreme puffiness (swelling) of your face, hands, ankles, feet, or legs. °· You have not felt the baby move in over an hour. °· You have severe headaches that do not go away with medicine. °· You have vision changes. °This information is not intended to replace advice given to you by your health care provider. Make   sure you discuss any questions you have with your health care provider. Document Released: 09/18/2009 Document Revised: 11/30/2015 Document Reviewed: 08/25/2012 Elsevier Interactive Patient Education  2017 [employer]. Introduction Patient Name: ________________________________________________ Patient Due Date: ____________________ What is a fetal movement count? A fetal movement count is the number of times that you feel your baby move during a certain amount of time. This may also be called a fetal kick count. A fetal movement count is recommended for  every pregnant woman. You may be asked to start counting fetal movements as early as week 28 of your pregnancy. Pay attention to when your baby is most active. You may notice your baby's sleep and wake cycles. You may also notice things that make your baby move more. You should do a fetal movement count:  When your baby is normally most active.  At the same time each day. A good time to count movements is while you are resting, after having something to eat and drink. How do I count fetal movements? 1. Find a quiet, comfortable area. Sit, or lie down on your side. 2. Write down the date, the start time and stop time, and the number of movements that you felt between those two times. Take this information with you to your health care visits. 3. For 2 hours, count kicks, flutters, swishes, rolls, and jabs. You should feel at least 10 movements during 2 hours. 4. You may stop counting after you have felt 10 movements. 5. If you do not feel 10 movements in 2 hours, have something to eat and drink. Then, keep resting and counting for 1 hour. If you feel at least 4 movements during that hour, you may stop counting. Contact a health care provider if:  You feel fewer than 4 movements in 2 hours.  Your baby is not moving like he or she usually does. Date: ____________ Start time: ____________ Stop time: ____________ Movements: ____________ Date: ____________ Start time: ____________ Stop time: ____________ Movements: ____________ Date: ____________ Start time: ____________ Stop time: ____________ Movements: ____________ Date: ____________ Start time: ____________ Stop time: ____________ Movements: ____________ Date: ____________ Start time: ____________ Stop time: ____________ Movements: ____________ Date: ____________ Start time: ____________ Stop time: ____________ Movements: ____________ Date: ____________ Start time: ____________ Stop time: ____________ Movements: ____________ Date: ____________  Start time: ____________ Stop time: ____________ Movements: ____________ Date: ____________ Start time: ____________ Stop time: ____________ Movements: ____________ This information is not intended to replace advice given to you by your health care provider. Make sure you discuss any questions you have with your health care provider. Document Released: 07/24/2006 Document Revised: 02/21/2016 Document Reviewed: 08/03/2015 Elsevier Interactive Patient Education  2017 [employer] Inc[employer] of the uterus can occur throughout pregnancy. Contractions are not always a sign that you are in labor.  WHAT ARE BRAXTON HICKS CONTRACTIONS?  Contractions that occur before labor are called Braxton Hicks contractions, or false labor. Toward the end of pregnancy (32-34 weeks), these contractions can develop more often and may become more forceful. This is not true labor because these contractions do not result in opening (dilatation) and thinning of the cervix. They are sometimes difficult to tell apart from true labor because these contractions can be forceful and people have different pain tolerances. You should not feel embarrassed if you go to the hospital with false labor. Sometimes, the only way to tell if you are in true labor is for your health care provider to look for changes in the cervix. If there  are no prenatal problems or other health problems associated with the pregnancy, it is completely safe to be sent home with false labor and await the onset of true labor. HOW CAN YOU TELL THE DIFFERENCE BETWEEN TRUE AND FALSE LABOR? False Labor   The contractions of false labor are usually shorter and not as hard as those of true labor.   The contractions are usually irregular.   The contractions are often felt in the front of the lower abdomen and in the groin.   The contractions may go away when you walk around or change positions while lying down.   The contractions  get weaker and are shorter lasting as time goes on.   The contractions do not usually become progressively stronger, regular, and closer together as with true labor.  True Labor   Contractions in true labor last 30-70 seconds, become very regular, usually become more intense, and increase in frequency.   The contractions do not go away with walking.   The discomfort is usually felt in the top of the uterus and spreads to the lower abdomen and low back.   True labor can be determined by your health care provider with an exam. This will show that the cervix is dilating and getting thinner.  WHAT TO REMEMBER  Keep up with your usual exercises and follow other instructions given by your health care provider.   Take medicines as directed by your health care provider.   Keep your regular prenatal appointments.   Eat and drink lightly if you think you are going into labor.   If Braxton Hicks contractions are making you uncomfortable:   Change your position from lying down or resting to walking, or from walking to resting.   Sit and rest in a tub of warm water.   Drink 2-3 glasses of water. Dehydration may cause these contractions.   Do slow and deep breathing several times an hour.  WHEN SHOULD I SEEK IMMEDIATE MEDICAL CARE? Seek immediate medical care if:  Your contractions become stronger, more regular, and closer together.   You have fluid leaking or gushing from your vagina.   You have a fever.   You pass blood-tinged mucus.   You have vaginal bleeding.   You have continuous abdominal pain.   You have low back pain that you never had before.   You feel your baby's head pushing down and causing pelvic pressure.   Your baby is not moving as much as it used to.  This information is not intended to replace advice given to you by your health care provider. Make sure you discuss any questions you have with your health care provider. Document  Released: 06/24/2005 Document Revised: 10/16/2015 Document Reviewed: 04/05/2013 Elsevier Interactive Patient Education  2017 [employer].

## 2016-07-12 NOTE — Telephone Encounter (Signed)
Patient reports she has vomiting this morning. She wants to talk to someone about this. Patient reports she had some contractions this morning and she had an episode of nausea followed with vomiting. She is getting ready to go to [facility]. Told patient she could be beginning her labor- she is thankful for call back and she is awaiting her ride.

## 2016-07-12 NOTE — MAU Note (Signed)
Urine in lab 

## 2016-07-12 NOTE — MAU Provider Note (Signed)
Chief Complaint:  Contractions and Nausea   First Provider Initiated Contact with Patient 07/12/16 1141     HPI  HPI: Julia Mayer is a 26 y.o. G5P1031 at 20w1dwho presents to maternity admissions reporting vomiting x 1.  Happened this morning.  Is having contractions, which are mildly painful.   She reports good fetal movement, denies LOF, vaginal bleeding, vaginal itching/burning, urinary symptoms, h/a, dizziness, diarrhea, constipation or fever/chills.  She denies headache, visual changes or RUQ abdominal pain.  RN Note: This morning was having contractions, became really nauseated and threw up.  Contractions are irregular.  Denies diarrhea  Past Medical History: Past Medical History:  Diagnosis Date  . Chlamydia   . No pertinent past medical history   . Normal pregnancy 07/19/2011  . SVD (spontaneous vaginal delivery) 07/20/2011  . Urinary tract infection during pregnancy 06/15/2011  . UTI (urinary tract infection)     Past obstetric history: OB History  Gravida Para Term Preterm AB Living  5 1 1  0 3 1  SAB TAB Ectopic Multiple Live Births  0 3 0 0 1    # Outcome Date GA Lbr Len/2nd Weight Sex Delivery Anes PTL Lv  5 Current           4 Term 07/20/11 [redacted]w[redacted]d 375:02 / 00:27  F Vag-Spont   LIV  3 TAB           2 TAB           1 TAB               Past Surgical History: Past Surgical History:  Procedure Laterality Date  . 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   . INDUCED ABORTION      Family History: Family History  Problem Relation Age of Onset  . Hypertension Mother   . Asthma Mother   . Alcohol abuse Mother   . Hypertension Father   . Hyperlipidemia Father   . Cancer Maternal Grandmother   . Cancer Paternal Grandmother   . Anesthesia problems Paternal Grandmother     Social History: Social History  Substance Use Topics  . Smoking status: Never Smoker  . Smokeless tobacco: Never Used  . Alcohol use No     Comment: occasionally but not during pregnancy     Allergies: No Known Allergies  Meds:  Prescriptions Prior to Admission  Medication Sig Dispense Refill Last Dose  . Prenat-FeCbn-FeAspGl-FA-Omega (OB COMPLETE PETITE) 35-5-1-200 MG CAPS Take 1 tablet by mouth daily. 30 capsule 12 07/11/2016 at Unknown time  . polyethylene glycol (MIRALAX / GLYCOLAX) packet Take 17 g by mouth daily. (Patient not taking: Reported on 07/12/2016) 14 each 3 Not Taking at Unknown time    I have reviewed patient's Past Medical Hx, Surgical Hx, Family Hx, Social Hx, medications and allergies.   ROS:  Review of Systems Other systems negative  Physical Exam  Patient Vitals for the past 24 hrs:  BP Temp Temp src Pulse Resp Weight  07/12/16 1111 - 97.7 F (36.5 C) Oral - 20 -  07/12/16 1047 128/76 97.7 F (36.5 C) Oral 87 18 208 lb (94.3 kg)   Constitutional: Well-developed, well-nourished female in no acute distress.  Cardiovascular: normal rate and rhythm Respiratory: normal effort, clear to auscultation bilaterally GI: Abd soft, non-tender, gravid appropriate for gestational age.   No rebound or guarding. MS: Extremities nontender, no edema, normal ROM Neurologic: Alert and oriented x 4.  GU: Neg CVAT.  Cervix 1/long/ballotable Recheck an hour later was Dilation: 2 Effacement (%): Thick Cervical Position: Posterior Station: Ballotable Presentation: Undeterminable Exam by::  (O'Dell/Schmidt RN's )   FHT:  Baseline 140 , moderate variability, accelerations present, no decelerations Contractions: q 3-5 mins Irregular     Labs:   Ref. Range 07/12/2016 21:36  Appearance Latest Ref Range: CLEAR  CLOUDY (A)  Bacteria, UA Latest Ref Range: NONE SEEN  RARE (A)  Bilirubin Urine Latest Ref Range: NEGATIVE  NEGATIVE  Color, Urine Latest Ref Range: YELLOW  YELLOW  Glucose Latest Ref Range: NEGATIVE mg/dL NEGATIVE  Hgb urine dipstick Latest Ref Range: NEGATIVE  SMALL (A)  Ketones, ur Latest Ref Range: NEGATIVE mg/dL NEGATIVE  Leukocytes, UA Latest Ref  Range: NEGATIVE  LARGE (A)  Mucous Unknown PRESENT  Nitrite Latest Ref Range: NEGATIVE  NEGATIVE  pH Latest Ref Range: 5.0 - 8.0  7.0  Protein Latest Ref Range: NEGATIVE mg/dL NEGATIVE  RBC / HPF Latest Ref Range: 0 - 5 RBC/hpf 6-30  Specific Gravity, Urine Latest Ref Range: 1.005 - 1.030  1.009  Squamous Epithelial / LPF Latest Ref Range: NONE SEEN  6-30 (A)  WBC, UA Latest Ref Range: 0 - 5 WBC/hpf TOO NUMEROUS TO C...   Imaging:  No results found.  MAU Course/MDM: I have ordered labs and reviewed results. Urine indicates good hydration. NST reviewed Does not appear dehydrated Suspect vomiting may be related to contractions or norovirus starting Not in labor  Assessment: 1. Encounter for supervision of normal pregnancy, antepartum, unspecified gravidity   2.    Irregular contractions, not in labor 3.    Single episode of vomiting  Plan: Discharge home Labor precautions and fetal kick counts Advance diet as tolerated, po hydration Follow up in Office for prenatal visits and recheck of status  Encouraged to return here or to other Urgent Care/ED if she develops worsening of symptoms, increase in pain, fever, or other concerning symptoms.   Pt stable at time of discharge.  Hansel Feinstein CNM, MSN Certified Nurse-Midwife 07/12/2016 11:42 AM

## 2016-07-15 ENCOUNTER — Ambulatory Visit (INDEPENDENT_AMBULATORY_CARE_PROVIDER_SITE_OTHER): Payer: Medicaid Other | Admitting: Certified Nurse Midwife

## 2016-07-15 ENCOUNTER — Other Ambulatory Visit: Payer: Self-pay | Admitting: Certified Nurse Midwife

## 2016-07-15 DIAGNOSIS — Z3483 Encounter for supervision of other normal pregnancy, third trimester: Secondary | ICD-10-CM | POA: Diagnosis not present

## 2016-07-15 DIAGNOSIS — Z3480 Encounter for supervision of other normal pregnancy, unspecified trimester: Secondary | ICD-10-CM

## 2016-07-15 NOTE — Patient Instructions (Addendum)
Third Trimester of Pregnancy The third trimester is from week 29 through week 40 (months 7 through 9). The third trimester is a time when the unborn baby (fetus) is growing rapidly. At the end of the ninth month, the fetus is about 20 inches in length and weighs 6-10 pounds. Body changes during your third trimester Your body goes through many changes during pregnancy. The changes vary from woman to woman. During the third trimester:  Your weight will continue to increase. You can expect to gain 25-35 pounds (11-16 kg) by the end of the pregnancy.  You may begin to get stretch marks on your hips, abdomen, and breasts.  You may urinate more often because the fetus is moving lower into your pelvis and pressing on your bladder.  You may develop or continue to have heartburn. This is caused by increased hormones that slow down muscles in the digestive tract.  You may develop or continue to have constipation because increased hormones slow digestion and cause the muscles that push waste through your intestines to relax.  You may develop hemorrhoids. These are swollen veins (varicose veins) in the rectum that can itch or be painful.  You may develop swollen, bulging veins (varicose veins) in your legs.  You may have increased body aches in the pelvis, back, or thighs. This is due to weight gain and increased hormones that are relaxing your joints.  You may have changes in your hair. These can include thickening of your hair, rapid growth, and changes in texture. Some women also have hair loss during or after pregnancy, or hair that feels dry or thin. Your hair will most likely return to normal after your baby is born.  Your breasts will continue to grow and they will continue to become tender. A yellow fluid (colostrum) may leak from your breasts. This is the first milk you are producing for your baby.  Your belly button may stick out.  You may notice more swelling in your hands, face, or  ankles.  You may have increased tingling or numbness in your hands, arms, and legs. The skin on your belly may also feel numb.  You may feel short of breath because of your expanding uterus.  You may have more problems sleeping. This can be caused by the size of your belly, increased need to urinate, and an increase in your body's metabolism.  You may notice the fetus "dropping," or moving lower in your abdomen.  You may have increased vaginal discharge.  Your cervix becomes thin and soft (effaced) near your due date. What to expect at prenatal visits You will have prenatal exams every 2 weeks until week 36. Then you will have weekly prenatal exams. During a routine prenatal visit:  You will be weighed to make sure you and the fetus are growing normally.  Your blood pressure will be taken.  Your abdomen will be measured to track your baby's growth.  The fetal heartbeat will be listened to.  Any test results from the previous visit will be discussed.  You may have a cervical check near your due date to see if you have effaced. At around 36 weeks, your health care provider will check your cervix. At the same time, your health care provider will also perform a test on the secretions of the vaginal tissue. This test is to determine if a type of bacteria, Group B streptococcus, is present. Your health care provider will explain this further. Your health care provider may ask you:    What your birth plan is.  How you are feeling.  If you are feeling the baby move.  If you have had any abnormal symptoms, such as leaking fluid, bleeding, severe headaches, or abdominal cramping.  If you are using any tobacco products, including cigarettes, chewing tobacco, and electronic cigarettes.  If you have any questions. Other tests or screenings that may be performed during your third trimester include:  Blood tests that check for low iron levels (anemia).  Fetal testing to check the health,  activity level, and growth of the fetus. Testing is done if you have certain medical conditions or if there are problems during the pregnancy.  Nonstress test (NST). This test checks the health of your baby to make sure there are no signs of problems, such as the baby not getting enough oxygen. During this test, a belt is placed around your belly. The baby is made to move, and its heart rate is monitored during movement. What is false labor? False labor is a condition in which you feel small, irregular tightenings of the muscles in the womb (contractions) that eventually go away. These are called Braxton Hicks contractions. Contractions may last for hours, days, or even weeks before true labor sets in. If contractions come at regular intervals, become more frequent, increase in intensity, or become painful, you should see your health care provider. What are the signs of labor?  Abdominal cramps.  Regular contractions that start at 10 minutes apart and become stronger and more frequent with time.  Contractions that start on the top of the uterus and spread down to the lower abdomen and back.  Increased pelvic pressure and dull back pain.  A watery or bloody mucus discharge that comes from the vagina.  Leaking of amniotic fluid. This is also known as your "water breaking." It could be a slow trickle or a gush. Let your doctor know if it has a color or strange odor. If you have any of these signs, call your health care provider right away, even if it is before your due date. Follow these instructions at home: Eating and drinking  Continue to eat regular, healthy meals.  Do not eat:  Raw meat or meat spreads.  Unpasteurized milk or cheese.  Unpasteurized juice.  Store-made salad.  Refrigerated smoked seafood.  Hot dogs or deli meat, unless they are piping hot.  More than 6 ounces of albacore tuna a week.  Shark, swordfish, king mackerel, or tile fish.  Store-made salads.  Raw  sprouts, such as mung bean or alfalfa sprouts.  Take prenatal vitamins as told by your health care provider.  Take 1000 mg of calcium daily as told by your health care provider.  If you develop constipation:  Take over-the-counter or prescription medicines.  Drink enough fluid to keep your urine clear or pale yellow.  Eat foods that are high in fiber, such as fresh fruits and vegetables, whole grains, and beans.  Limit foods that are high in fat and processed sugars, such as fried and sweet foods. Activity  Exercise only as directed by your health care provider. Healthy pregnant women should aim for 2 hours and 30 minutes of moderate exercise per week. If you experience any pain or discomfort while exercising, stop.  Avoid heavy lifting.  Do not exercise in extreme heat or humidity, or at high altitudes.  Wear low-heel, comfortable shoes.  Practice good posture.  Do not travel far distances unless it is absolutely necessary and only with the approval   of your health care provider.  Wear your seat belt at all times while in a car, on a bus, or on a plane.  Take frequent breaks and rest with your legs elevated if you have leg cramps or low back pain.  Do not use hot tubs, steam rooms, or saunas.  You may continue to have sex unless your health care provider tells you otherwise. Lifestyle  Do not use any products that contain nicotine or tobacco, such as cigarettes and e-cigarettes. If you need help quitting, ask your health care provider.  Do not drink alcohol.  Do not use any medicinal herbs or unprescribed drugs. These chemicals affect the formation and growth of the baby.  If you develop varicose veins:  Wear support pantyhose or compression stockings as told by your healthcare provider.  Elevate your feet for 15 minutes, 3-4 times a day.  Wear a supportive maternity bra to help with breast tenderness. General instructions  Take over-the-counter and prescription  medicines only as told by your health care provider. There are medicines that are either safe or unsafe to take during pregnancy.  Take warm sitz baths to soothe any pain or discomfort caused by hemorrhoids. Use hemorrhoid cream or witch hazel if your health care provider approves.  Avoid cat litter boxes and soil used by cats. These carry germs that can cause birth defects in the baby. If you have a cat, ask someone to clean the litter box for you.  To prepare for the arrival of your baby:  Take prenatal classes to understand, practice, and ask questions about the labor and delivery.  Make a trial run to the hospital.  Visit the hospital and tour the maternity area.  Arrange for maternity or paternity leave through employers.  Arrange for family and friends to take care of pets while you are in the hospital.  Purchase a rear-facing car seat and make sure you know how to install it in your car.  Pack your hospital bag.  Prepare the baby's nursery. Make sure to remove all pillows and stuffed animals from the baby's crib to prevent suffocation.  Visit your dentist if you have not gone during your pregnancy. Use a soft toothbrush to brush your teeth and be gentle when you floss.  Keep all prenatal follow-up visits as told by your health care provider. This is important. Contact a health care provider if:  You are unsure if you are in labor or if your water has broken.  You become dizzy.  You have mild pelvic cramps, pelvic pressure, or nagging pain in your abdominal area.  You have lower back pain.  You have persistent nausea, vomiting, or diarrhea.  You have an unusual or bad smelling vaginal discharge.  You have pain when you urinate. Get help right away if:  You have a fever.  You are leaking fluid from your vagina.  You have spotting or bleeding from your vagina.  You have severe abdominal pain or cramping.  You have rapid weight loss or weight gain.  You have  shortness of breath with chest pain.  You notice sudden or extreme swelling of your face, hands, ankles, feet, or legs.  Your baby makes fewer than 10 movements in 2 hours.  You have severe headaches that do not go away with medicine.  You have vision changes. Summary  The third trimester is from week 29 through week 40, months 7 through 9. The third trimester is a time when the unborn baby (fetus)   is growing rapidly.  During the third trimester, your discomfort may increase as you and your baby continue to gain weight. You may have abdominal, leg, and back pain, sleeping problems, and an increased need to urinate.  During the third trimester your breasts will keep growing and they will continue to become tender. A yellow fluid (colostrum) may leak from your breasts. This is the first milk you are producing for your baby.  False labor is a condition in which you feel small, irregular tightenings of the muscles in the womb (contractions) that eventually go away. These are called Braxton Hicks contractions. Contractions may last for hours, days, or even weeks before true labor sets in.  Signs of labor can include: abdominal cramps; regular contractions that start at 10 minutes apart and become stronger and more frequent with time; watery or bloody mucus discharge that comes from the vagina; increased pelvic pressure and dull back pain; and leaking of amniotic fluid. This information is not intended to replace advice given to you by your health care provider. Make sure you discuss any questions you have with your health care provider. Document Released: 06/18/2001 Document Revised: 11/30/2015 Document Reviewed: 08/25/2012 Elsevier Interactive Patient Education  2017 [employer]  

## 2016-07-15 NOTE — Progress Notes (Signed)
   PRENATAL VISIT NOTE  Subjective:  Julia Mayer is a 26 y.o. G5P1031 at [redacted]w[redacted]d being seen today for ongoing prenatal care.  She is currently monitored for the following issues for this low-risk pregnancy and has Candida vaginitis and Supervision of normal pregnancy, antepartum on her problem list.  Patient reports backache, no bleeding, no leaking, occasional contractions and did have Braxton-Hicks contractions over the weekend and was seen in MAU, desires cervical exam today.  Contractions: Irregular. Vag. Bleeding: None.  Movement: Present. Denies leaking of fluid.   The following portions of the patient's history were reviewed and updated as appropriate: allergies, current medications, past family history, past medical history, past social history, past surgical history and problem list. Problem list updated.  Objective:  There were no vitals filed for this visit.  Fetal Status: Fetal Heart Rate (bpm): 152 Fundal Height: 39 cm Movement: Present  Presentation: Vertex  General:  Alert, oriented and cooperative. Patient is in no acute distress.  Skin: Skin is warm and dry. No rash noted.   Cardiovascular: Normal heart rate noted  Respiratory: Normal respiratory effort, no problems with respiration noted  Abdomen: Soft, gravid, appropriate for gestational age.       Pelvic:  Cervical exam performed Dilation: 2.5 Effacement (%): 0 Station: -3  Extremities: Normal range of motion.     Mental Status: Normal mood and affect. Normal behavior. Normal judgment and thought content.   Assessment and Plan:  Pregnancy: [id number] at [redacted]w[redacted]d  1. Supervision of other normal pregnancy, antepartum Doing well  Term labor symptoms and general obstetric precautions including but not limited to vaginal bleeding, contractions, leaking of fluid and fetal movement were reviewed in detail with the patient. Please refer to After Visit Summary for other counseling recommendations.  Return in about 1 week (around  07/22/2016) for [place].   Morene Crocker, CNM

## 2016-07-25 ENCOUNTER — Inpatient Hospital Stay (HOSPITAL_COMMUNITY)
Admission: AD | Admit: 2016-07-25 | Discharge: 2016-07-27 | DRG: 775 | Disposition: A | Discharge status: Home / Self Care | Payer: Medicaid Other | Source: Ambulatory Visit | Attending: Obstetrics & Gynecology | Admitting: Obstetrics & Gynecology

## 2016-07-25 ENCOUNTER — Inpatient Hospital Stay (HOSPITAL_COMMUNITY): Payer: Medicaid Other | Admitting: Anesthesiology

## 2016-07-25 ENCOUNTER — Encounter (HOSPITAL_COMMUNITY): Payer: Self-pay

## 2016-07-25 DIAGNOSIS — Z3480 Encounter for supervision of other normal pregnancy, unspecified trimester: Secondary | ICD-10-CM

## 2016-07-25 DIAGNOSIS — O4202 Full-term premature rupture of membranes, onset of labor within 24 hours of rupture: Secondary | ICD-10-CM | POA: Diagnosis present

## 2016-07-25 DIAGNOSIS — O99214 Obesity complicating childbirth: Secondary | ICD-10-CM | POA: Diagnosis present

## 2016-07-25 DIAGNOSIS — Z6837 Body mass index (BMI) 37.0-37.9, adult: Secondary | ICD-10-CM | POA: Diagnosis not present

## 2016-07-25 DIAGNOSIS — Z3A40 40 weeks gestation of pregnancy: Secondary | ICD-10-CM

## 2016-07-25 DIAGNOSIS — O411230 Chorioamnionitis, third trimester, not applicable or unspecified: Secondary | ICD-10-CM | POA: Diagnosis present

## 2016-07-25 DIAGNOSIS — Z3490 Encounter for supervision of normal pregnancy, unspecified, unspecified trimester: Secondary | ICD-10-CM

## 2016-07-25 DIAGNOSIS — Z23 Encounter for immunization: Secondary | ICD-10-CM | POA: Diagnosis not present

## 2016-07-25 DIAGNOSIS — Z8249 Family history of ischemic heart disease and other diseases of the circulatory system: Secondary | ICD-10-CM | POA: Diagnosis not present

## 2016-07-25 LAB — CBC
HEMATOCRIT: 33.7 % — AB (ref 36.0–46.0)
Hemoglobin: 11.1 g/dL — ABNORMAL LOW (ref 12.0–15.0)
MCH: 28.2 pg (ref 26.0–34.0)
MCHC: 32.9 g/dL (ref 30.0–36.0)
MCV: 85.8 fL (ref 78.0–100.0)
Platelets: 207 10*3/uL (ref 150–400)
RBC: 3.93 MIL/uL (ref 3.87–5.11)
RDW: 14.7 % (ref 11.5–15.5)
WBC: 11.3 10*3/uL — AB (ref 4.0–10.5)

## 2016-07-25 LAB — HEPATITIS B SURFACE ANTIGEN: HEP B S AG: NEGATIVE

## 2016-07-25 LAB — TYPE AND SCREEN
ABO/RH(D): A POS
Antibody Screen: NEGATIVE

## 2016-07-25 LAB — RPR: RPR: NONREACTIVE

## 2016-07-25 MED ORDER — GENTAMICIN SULFATE 40 MG/ML IJ SOLN
140.0000 mg | Freq: Three times a day (TID) | INTRAVENOUS | Status: DC
  Administered 2016-07-25: 140 mg via INTRAVENOUS
  Filled 2016-07-25 (×2): qty 3.5

## 2016-07-25 MED ORDER — DIBUCAINE 1 % RE OINT: 1.0000 "application " | TOPICAL_OINTMENT | RECTAL | Status: DC | PRN

## 2016-07-25 MED ORDER — LIDOCAINE HCL (PF) 1 % IJ SOLN
INTRAMUSCULAR | Status: DC | PRN
  Administered 2016-07-25 (×2): 5 mL via EPIDURAL

## 2016-07-25 MED ORDER — FLEET ENEMA 7-19 GM/118ML RE ENEM: 1.0000 | ENEMA | RECTAL | Status: DC | PRN

## 2016-07-25 MED ORDER — EPHEDRINE 5 MG/ML INJ
10.0000 mg | INTRAVENOUS | Status: DC | PRN
  Filled 2016-07-25: qty 4

## 2016-07-25 MED ORDER — TERBUTALINE SULFATE 1 MG/ML IJ SOLN
0.2500 mg | Freq: Once | INTRAMUSCULAR | Status: DC | PRN
  Filled 2016-07-25: qty 1

## 2016-07-25 MED ORDER — SENNOSIDES-DOCUSATE SODIUM 8.6-50 MG PO TABS
2.0000 | ORAL_TABLET | ORAL | Status: DC
  Administered 2016-07-25 – 2016-07-27 (×2): 2 via ORAL
  Filled 2016-07-25 (×2): qty 2

## 2016-07-25 MED ORDER — ACETAMINOPHEN 325 MG PO TABS
650.0000 mg | ORAL_TABLET | ORAL | Status: DC | PRN
  Administered 2016-07-25: 650 mg via ORAL
  Filled 2016-07-25 (×2): qty 2

## 2016-07-25 MED ORDER — SIMETHICONE 80 MG PO CHEW
80.0000 mg | CHEWABLE_TABLET | ORAL | Status: DC | PRN
Start: 2016-07-25 — End: 2016-07-27

## 2016-07-25 MED ORDER — TETANUS-DIPHTH-ACELL PERTUSSIS 5-2.5-18.5 LF-MCG/0.5 IM SUSP: 0.5000 mL | Freq: Once | INTRAMUSCULAR | Status: DC

## 2016-07-25 MED ORDER — LIDOCAINE HCL (PF) 1 % IJ SOLN
30.0000 mL | INTRAMUSCULAR | Status: DC | PRN
  Filled 2016-07-25: qty 30

## 2016-07-25 MED ORDER — PHENYLEPHRINE 40 MCG/ML (10ML) SYRINGE FOR IV PUSH (FOR BLOOD PRESSURE SUPPORT)
80.0000 ug | PREFILLED_SYRINGE | INTRAVENOUS | Status: DC | PRN
  Filled 2016-07-25: qty 5
  Filled 2016-07-25: qty 10

## 2016-07-25 MED ORDER — ONDANSETRON HCL 4 MG PO TABS: 4.0000 mg | ORAL_TABLET | ORAL | Status: DC | PRN

## 2016-07-25 MED ORDER — PHENYLEPHRINE 40 MCG/ML (10ML) SYRINGE FOR IV PUSH (FOR BLOOD PRESSURE SUPPORT)
80.0000 ug | PREFILLED_SYRINGE | INTRAVENOUS | Status: DC | PRN
  Filled 2016-07-25: qty 5

## 2016-07-25 MED ORDER — OXYCODONE-ACETAMINOPHEN 5-325 MG PO TABS: 1.0000 | ORAL_TABLET | ORAL | Status: DC | PRN

## 2016-07-25 MED ORDER — LACTATED RINGERS IV SOLN: 500.0000 mL | INTRAVENOUS | Status: DC | PRN

## 2016-07-25 MED ORDER — LACTATED RINGERS IV SOLN
INTRAVENOUS | Status: DC
  Administered 2016-07-25 (×2): via INTRAVENOUS

## 2016-07-25 MED ORDER — PRENATAL MULTIVITAMIN CH
1.0000 | ORAL_TABLET | Freq: Every day | ORAL | Status: DC
  Administered 2016-07-26: 1 via ORAL
  Filled 2016-07-25: qty 1

## 2016-07-25 MED ORDER — ONDANSETRON HCL 4 MG/2ML IJ SOLN
4.0000 mg | Freq: Four times a day (QID) | INTRAMUSCULAR | Status: DC | PRN
  Administered 2016-07-25: 4 mg via INTRAVENOUS
  Filled 2016-07-25: qty 2

## 2016-07-25 MED ORDER — FENTANYL CITRATE (PF) 100 MCG/2ML IJ SOLN: 100.0000 ug | INTRAMUSCULAR | Status: DC | PRN

## 2016-07-25 MED ORDER — DIPHENHYDRAMINE HCL 50 MG/ML IJ SOLN: 12.5000 mg | INTRAMUSCULAR | Status: DC | PRN

## 2016-07-25 MED ORDER — SOD CITRATE-CITRIC ACID 500-334 MG/5ML PO SOLN
30.0000 mL | ORAL | Status: DC | PRN
  Filled 2016-07-25: qty 15

## 2016-07-25 MED ORDER — ACETAMINOPHEN 500 MG PO TABS
1000.0000 mg | ORAL_TABLET | Freq: Four times a day (QID) | ORAL | Status: DC | PRN
  Administered 2016-07-25: 1000 mg via ORAL
  Filled 2016-07-25: qty 2

## 2016-07-25 MED ORDER — OXYTOCIN 40 UNITS IN LACTATED RINGERS INFUSION - SIMPLE MED
1.0000 m[IU]/min | INTRAVENOUS | Status: DC
  Administered 2016-07-25: 2 m[IU]/min via INTRAVENOUS
  Filled 2016-07-25: qty 1000

## 2016-07-25 MED ORDER — OXYCODONE-ACETAMINOPHEN 5-325 MG PO TABS: 2.0000 | ORAL_TABLET | ORAL | Status: DC | PRN

## 2016-07-25 MED ORDER — COCONUT OIL OIL: 1.0000 "application " | TOPICAL_OIL | Status: DC | PRN

## 2016-07-25 MED ORDER — BENZOCAINE-MENTHOL 20-0.5 % EX AERO
1.0000 "application " | INHALATION_SPRAY | CUTANEOUS | Status: DC | PRN
  Administered 2016-07-26: 1 via TOPICAL
  Filled 2016-07-25: qty 56

## 2016-07-25 MED ORDER — AMPICILLIN SODIUM 2 G IJ SOLR
2.0000 g | Freq: Once | INTRAMUSCULAR | Status: AC
  Administered 2016-07-25: 2 g via INTRAVENOUS
  Filled 2016-07-25: qty 2000

## 2016-07-25 MED ORDER — OXYTOCIN BOLUS FROM INFUSION
500.0000 mL | Freq: Once | INTRAVENOUS | Status: AC
  Administered 2016-07-25: 500 mL via INTRAVENOUS

## 2016-07-25 MED ORDER — SODIUM CHLORIDE 0.9 % IV SOLN
2.0000 g | Freq: Once | INTRAVENOUS | Status: AC
  Administered 2016-07-25: 2 g via INTRAVENOUS
  Filled 2016-07-25: qty 2000

## 2016-07-25 MED ORDER — ACETAMINOPHEN 325 MG PO TABS: 650.0000 mg | ORAL_TABLET | ORAL | Status: DC | PRN

## 2016-07-25 MED ORDER — ONDANSETRON HCL 4 MG/2ML IJ SOLN: 4.0000 mg | INTRAMUSCULAR | Status: DC | PRN

## 2016-07-25 MED ORDER — OXYTOCIN 40 UNITS IN LACTATED RINGERS INFUSION - SIMPLE MED: 2.5000 [IU]/h | INTRAVENOUS | Status: DC

## 2016-07-25 MED ORDER — WITCH HAZEL-GLYCERIN EX PADS: 1.0000 "application " | MEDICATED_PAD | CUTANEOUS | Status: DC | PRN

## 2016-07-25 MED ORDER — DIPHENHYDRAMINE HCL 25 MG PO CAPS: 25.0000 mg | ORAL_CAPSULE | Freq: Four times a day (QID) | ORAL | Status: DC | PRN

## 2016-07-25 MED ORDER — LACTATED RINGERS IV SOLN
500.0000 mL | Freq: Once | INTRAVENOUS | Status: AC
  Administered 2016-07-25: 500 mL via INTRAVENOUS

## 2016-07-25 MED ORDER — FENTANYL 2.5 MCG/ML BUPIVACAINE 1/10 % EPIDURAL INFUSION (WH - ANES)
14.0000 mL/h | INTRAMUSCULAR | Status: DC | PRN
  Administered 2016-07-25 (×2): 14 mL/h via EPIDURAL
  Filled 2016-07-25 (×2): qty 100

## 2016-07-25 MED ORDER — ZOLPIDEM TARTRATE 5 MG PO TABS: 5.0000 mg | ORAL_TABLET | Freq: Every evening | ORAL | Status: DC | PRN

## 2016-07-25 MED ORDER — IBUPROFEN 600 MG PO TABS
600.0000 mg | ORAL_TABLET | Freq: Four times a day (QID) | ORAL | Status: DC
  Administered 2016-07-25 – 2016-07-27 (×7): 600 mg via ORAL
  Filled 2016-07-25 (×7): qty 1

## 2016-07-25 NOTE — Anesthesia Procedure Notes (Signed)
Epidural Patient location during procedure: OB Start time: 07/25/2016 5:06 AM End time: 07/25/2016 5:19 AM  Staffing Anesthesiologist: Duane Boston Performed: anesthesiologist   Preanesthetic Checklist Completed: patient identified, site marked, pre-op evaluation, timeout performed, IV checked, risks and benefits discussed and monitors and equipment checked  Epidural Patient position: sitting Prep: DuraPrep Patient monitoring: heart rate, cardiac monitor, continuous pulse ox and blood pressure Approach: midline Location: L2-L3 Injection technique: LOR saline  Needle:  Needle type: Tuohy  Needle gauge: 17 G Needle length: 9 cm Needle insertion depth: 8 cm Catheter size: 20 Guage Catheter at skin depth: 12 cm Test dose: negative and Other  Assessment Events: blood not aspirated, injection not painful, no injection resistance and negative IV test  Additional Notes Informed consent obtained prior to proceeding including risk of failure, 1% risk of PDPH, risk of minor discomfort and bruising.  Discussed rare but serious complications including epidural abscess, permanent nerve injury, epidural hematoma.  Discussed alternatives to epidural analgesia and patient desires to proceed.  Timeout performed pre-procedure verifying patient name, procedure, and platelet count.  Patient tolerated procedure well.

## 2016-07-25 NOTE — MAU Note (Signed)
Water broke at 0230. Clear fluid. Baby moving well. No bleeding. Contractions every 4-6 min apart. Came in by EMS.

## 2016-07-25 NOTE — Progress Notes (Signed)
Notified of pt arrival in MAU and exam with SROM. Ok to admit to labor and delivery.

## 2016-07-25 NOTE — Anesthesia Postprocedure Evaluation (Signed)
Anesthesia Post Note  Patient: Julia Mayer  Procedure(s) Performed: * No procedures listed *  Patient location during evaluation: Mother Baby Anesthesia Type: Epidural Level of consciousness: awake and alert Pain management: pain level controlled Vital Signs Assessment: post-procedure vital signs reviewed and stable Respiratory status: spontaneous breathing, nonlabored ventilation and respiratory function stable Cardiovascular status: stable Postop Assessment: no headache, no backache and epidural receding Anesthetic complications: no        Last Vitals:  Vitals:   07/25/16 1701 07/25/16 1720  BP: 116/65 124/65  Pulse: 84 85  Resp: 18 18  Temp:  36.7 C    Last Pain:  Vitals:   07/25/16 1720  TempSrc: Oral  PainSc: 0-No pain   Pain Goal: Patients Stated Pain Goal: 0 (07/25/16 0340)               Gilmer Mor

## 2016-07-25 NOTE — H&P (Signed)
LABOR AND DELIVERY ADMISSION HISTORY AND PHYSICAL NOTE  Julia Mayer is a 26 y.o. female [phone] with IUP at [redacted]w[redacted]d by LMP presenting for labor following SROM around 0230.  Denies any CP, SOB, NV or diarrhea.  She does report positive fetal movement and denies any vaginal bleeding.    Prenatal History/Complications:  Past Medical History: Past Medical History:  Diagnosis Date  . Chlamydia   . No pertinent past medical history   . Normal pregnancy 07/19/2011  . SVD (spontaneous vaginal delivery) 07/20/2011  . Urinary tract infection during pregnancy 06/15/2011  . UTI (urinary tract infection)     Past Surgical History: Past Surgical History:  Procedure Laterality Date  . 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   . INDUCED ABORTION      Obstetrical History: OB History    Gravida Para Term Preterm AB Living   5 1 1  0 3 1   SAB TAB Ectopic Multiple Live Births   0 3 0 0 1      Social History: Social History   Social History  . Marital status: Single    Spouse name: N/A  . Number of children: N/A  . Years of education: N/A   Social History Main Topics  . Smoking status: Never Smoker  . Smokeless tobacco: Never Used  . Alcohol use No     Comment: occasionally but not during pregnancy  . Drug use: No  . Sexual activity: Yes    Birth control/ protection: None   Other Topics Concern  . None   Social History Narrative  . None    Family History: Family History  Problem Relation Age of Onset  . Hypertension Mother   . Asthma Mother   . Alcohol abuse Mother   . Hypertension Father   . Hyperlipidemia Father   . Cancer Maternal Grandmother   . Cancer Paternal Grandmother   . Anesthesia problems Paternal Grandmother     Allergies: No Known Allergies  Prescriptions Prior to Admission  Medication Sig Dispense Refill Last Dose  . Prenat-FeCbn-FeAspGl-FA-Omega (OB COMPLETE PETITE) 35-5-1-200 MG CAPS Take 1 tablet by mouth daily. 30 capsule 12 Past Week at  Unknown time  . polyethylene glycol (MIRALAX / GLYCOLAX) packet Take 17 g by mouth daily. 14 each 3 Taking     Review of Systems   All systems reviewed and negative except as stated in HPI  Blood pressure 130/78, pulse 89, temperature 98.1 F (36.7 C), temperature source Oral, resp. rate 16, last menstrual period 10/19/2015, SpO2 98 %. General appearance: alert and cooperative Lungs: clear to auscultation bilaterally Heart: regular rate and rhythm Abdomen: soft, non-tender; bowel sounds normal Extremities: No calf swelling or tenderness Presentation: cephalic, vertex position Fetal monitoring: FHR 150s, mod variability, pos accels, no decels, contractions q90min Uterine activity:  Dilation: 3 Effacement (%): 90 Station: -3 Exam by:: Izora Gala RN   Prenatal labs: ABO, Rh:  A+ Antibody:   Rubella: ordered, pending RPR: Non Reactive (11/15 1025)  HBsAg:   ordered, pending HIV: Non Reactive (11/15 1025)  GBS: Negative (12/14 1650)  1 hr Glucola: 3hr 74/142/122 Genetic screening:  normal Anatomy US: female, wnl  Prenatal Transfer Tool  Maternal Diabetes: No Genetic Screening: Normal Maternal Ultrasounds/Referrals: Normal Fetal Ultrasounds or other Referrals:  None Maternal Substance Abuse:  No Significant Maternal Medications:  None Significant Maternal Lab Results: Lab values include: Group B Strep negative  No results found for this or any previous  visit (from the past 24 hour(s)).  Patient Active Problem List   Diagnosis Date Noted  . Pregnancy 07/25/2016  . Supervision of normal pregnancy, antepartum 05/15/2016  . Candida vaginitis 01/28/2011    Assessment: Julia Mayer is a 26 y.o. G5P1031 at [redacted]w[redacted]d here for labor after SROM around 0230  #Labor: anticipated SVD #Pain: IV pain medication/epidural #FWB: Category I #ID: GBS neg #MOF:  breast #MOC: undecided  Eloise Levels, MD PGY-1 07/25/2016, 4:15 AM  CNM attestation:  I have seen and examined  this patient; I agree with above documentation in the resident's note.   Julia Mayer is a 26 y.o. [phone] here for SROM  PE: BP 123/68   Pulse 60   Temp 98.1 F (36.7 C) (Oral)   Resp 18   Ht 5\' 3"  (1.6 m)   Wt 95.5 kg (210 lb 9.6 oz)   LMP 10/19/2015   SpO2 99%   BMI 37.31 kg/m  Gen: calm comfortable, NAD Resp: normal effort, no distress Abd: gravid  ROS, labs, PMH reviewed  Plan: Admit to [facility] Expectant management Plans epidural Anticipate SVD  Serita Grammes CNM 07/25/2016, 7:50 AM

## 2016-07-25 NOTE — Progress Notes (Signed)
ANTIBIOTIC CONSULT NOTE - INITIAL  Pharmacy Consult for Gentamicin Indication: Chorioamnionitis   No Known Allergies  Patient Measurements: Height: 5\' 3"  (160 cm) Weight: 210 lb 9.6 oz (95.5 kg) IBW/kg (Calculated) : 52.4 kg Adjusted Body Weight:66 kg  Vital Signs: Temp: 102.5 F (39.2 C) (01/18 1249) Temp Source: Axillary (01/18 1249) BP: 123/67 (01/18 1249) Pulse Rate: 104 (01/18 1249)  Labs:  Recent Labs  07/25/16 0438  WBC 11.3*  HGB 11.1*  PLT 207   No SCr available this admission; estimated CrCl = 100 ml/min using an estimated SCr of 0.7     Medications:  Ampicillin 2 grams IV x 1  Assessment: 26 y.o. female G5P1031 at [redacted]w[redacted]d developed fever during labor Estimated Ke = 0.302, Vd = 0.33 L/kg  Goal of Therapy:  Gentamicin peak 6-8 mg/L and Trough < 1 mg/L  Plan:  Gentamicin 140 mg IV every 8 hrs  Check Scr with next labs if gentamicin continued. Will check gentamicin levels if continued > 72hr or clinically indicated.  Beryle Lathe 07/25/2016,1:06 PM

## 2016-07-25 NOTE — Progress Notes (Signed)
S: Patient seen & examined for progress of labor. Patient comfortable with epidural.     O:  Vitals:   07/25/16 1001 07/25/16 1032 07/25/16 1033 07/25/16 1103  BP: 114/64  116/61 100/78  Pulse: 65  67 88  Resp: 16 18  18   Temp:      TempSrc:      SpO2:      Weight:      Height:        Dilation: 7 Effacement (%): 80 Cervical Position: Middle Station: -1 Presentation: Vertex Exam by:: Evonnie Dawes, rn   FHT: 150s bpm, mod var, +accels, no decels TOCO: q 2-59min   A/P: Continue expectant management Anticipate SVD   Crissie Sickles, MD PGY-2 07/25/2016 11:10 AM

## 2016-07-25 NOTE — Progress Notes (Signed)
Spoke with Dr Megan Salon with teaching service about possibly continuing IV antibiotics for 24 hrs with the temp that she had in L&D. Instructed to just leave the one time dose that was given, and continue to monitor temps throughout the night. Call with updates as needed. Wille Celeste

## 2016-07-25 NOTE — Anesthesia Preprocedure Evaluation (Signed)
Anesthesia Evaluation  Patient identified by MRN, date of birth, ID band Patient awake    Reviewed: Allergy & Precautions, H&P , NPO status , Patient's Chart, lab work & pertinent test results  History of Anesthesia Complications Negative for: history of anesthetic complications  Airway Mallampati: II  TM Distance: >3 FB Neck ROM: full    Dental no notable dental hx. (+) Dental Advisory Given   Pulmonary neg pulmonary ROS,    Pulmonary exam normal        Cardiovascular negative cardio ROS Normal cardiovascular exam     Neuro/Psych negative neurological ROS  negative psych ROS   GI/Hepatic negative GI ROS, Neg liver ROS,   Endo/Other  Morbid obesity  Renal/GU negative Renal ROS  negative genitourinary   Musculoskeletal negative musculoskeletal ROS (+)   Abdominal (+) + obese,   Peds negative pediatric ROS (+)  Hematology negative hematology ROS (+)   Anesthesia Other Findings   Reproductive/Obstetrics (+) Pregnancy                             Anesthesia Physical  Anesthesia Plan  ASA: III  Anesthesia Plan: Epidural   Post-op Pain Management:    Induction:   Airway Management Planned: Natural Airway  Additional Equipment:   Intra-op Plan:   Post-operative Plan:   Informed Consent: I have reviewed the patients History and Physical, chart, labs and discussed the procedure including the risks, benefits and alternatives for the proposed anesthesia with the patient or authorized representative who has indicated his/her understanding and acceptance.     Plan Discussed with:   Anesthesia Plan Comments:         Anesthesia Quick Evaluation

## 2016-07-26 ENCOUNTER — Encounter: Payer: Medicaid Other | Admitting: Certified Nurse Midwife

## 2016-07-26 LAB — RUBELLA SCREEN: RUBELLA: 12.1 {index} (ref >0.99)

## 2016-07-26 MED ORDER — ACETAMINOPHEN 325 MG PO TABS
650.0000 mg | ORAL_TABLET | ORAL | Status: DC | PRN
  Administered 2016-07-26: 650 mg via ORAL
  Filled 2016-07-26: qty 2

## 2016-07-26 MED ORDER — OXYCODONE-ACETAMINOPHEN 5-325 MG PO TABS
1.0000 | ORAL_TABLET | ORAL | Status: DC | PRN
  Administered 2016-07-26 – 2016-07-27 (×3): 1 via ORAL
  Filled 2016-07-26 (×3): qty 1

## 2016-07-26 NOTE — Lactation Note (Signed)
This note was copied from a baby's chart. Lactation Consultation Note New mom states BF going ok, I''m trying. Offered to assist in latching at this Time. Baby hasn't BF much. Mom states baby is sleeping right now. Educated newborn behavior and feeding habits, I&O, doing STS, cluster feeding, supply and demand, stimulating baby to BF every 2-3 hours and on cueing.  Mom has large pendulum breast when laying breast lay to the side of mom under axillary area. Nipple at bottom end of breast compressible. Hand expression taught w/easy expressed colostrum.  Mom has [place]. [place] doesn't feel that mom is committed to BF. Acts reluctant. encouraged to call for assistance or has questions or concerns.  Patient Name: Julia Mayer [id number] Date: 07/26/2016 Reason for consult: Initial assessment   Maternal Data Has patient been taught Hand Expression?: Yes Does the patient have breastfeeding experience prior to this delivery?: No  Feeding    LATCH Score/Interventions       Type of Nipple: Everted at rest and after stimulation  Comfort (Breast/Nipple): Soft / non-tender     Intervention(s): Breastfeeding basics reviewed;Support Pillows;Position options;Skin to skin     Lactation Tools Discussed/Used WIC Program: Yes   Consult Status Consult Status: Follow-up Date: 07/27/16 Follow-up type: In-patient    Theodoro Kalata 07/26/2016, 6:55 AM

## 2016-07-26 NOTE — Progress Notes (Signed)
Post Partum Day #1 Subjective: up ad lib, voiding, tolerating PO and is having difficulties with breastfeeding  Objective: Blood pressure 115/68, pulse 70, temperature 97.4 F (36.3 C), temperature source Oral, resp. rate 18, height 5\' 3"  (1.6 m), weight 210 lb 9.6 oz (95.5 kg), last menstrual period 10/19/2015, SpO2 100 %, unknown if currently breastfeeding.  Physical Exam:  General: alert, cooperative and no distress Lochia: appropriate Uterine Fundus: firm Incision: no significant drainage, no dehiscence, no significant erythema DVT Evaluation: No evidence of DVT seen on physical exam. No cords or calf tenderness. No significant calf/ankle edema.   Recent Labs  07/25/16 0438  HGB 11.1*  HCT 33.7*    Assessment/Plan: Plan for discharge tomorrow, Breastfeeding, Lactation consult and Contraception undecided   LOS: 1 day   Julia Mayer, CNM 07/26/2016, 8:07 AM

## 2016-07-26 NOTE — Progress Notes (Signed)
UR chart review completed.  

## 2016-07-27 NOTE — Lactation Note (Signed)
This note was copied from a baby's chart. Lactation Consultation Note  RN viewed latch and stated baby was latched well. Discussed depth, swallowing and breastfeeding on both breasts per feeding. Mom encouraged to feed baby 8-12 times/24 hours and with feeding cues.  Reviewed engorgement care and monitoring voids/stools. Mother has manual pump. Suggest calling for further assistance with next feeding. Mom made aware of O/P services and our phone # for post-discharge questions.    Patient Name: Julia Mayer [id number] Date: 07/27/2016 Reason for consult: Follow-up assessment   Maternal Data    Feeding Feeding Type: Breast Fed Length of feed: 25 min  LATCH Score/Interventions Latch: Repeated attempts needed to sustain latch, nipple held in mouth throughout feeding, stimulation needed to elicit sucking reflex. Intervention(s): Skin to skin;Waking techniques Intervention(s): Assist with latch  Audible Swallowing: Spontaneous and intermittent Intervention(s): Skin to skin  Type of Nipple: Everted at rest and after stimulation  Comfort (Breast/Nipple): Soft / non-tender     Hold (Positioning): No assistance needed to correctly position infant at breast.  LATCH Score: 9  Lactation Tools Discussed/Used     Consult Status Consult Status: Complete    Carlye Grippe 07/27/2016, 10:14 AM

## 2016-07-27 NOTE — Discharge Summary (Signed)
OB Discharge Summary     Patient Name: Julia Mayer DOB: June 30, 1991 MRN: [id number]  Date of admission: 07/25/2016 Delivering MD: Woodroe Mode   Date of discharge: 07/27/2016  Admitting diagnosis: Labor Check  Intrauterine pregnancy: [redacted]w[redacted]d     Secondary diagnosis:  Active Problems:   Pregnancy  Additional problems: Triple I in labor     Discharge diagnosis: Term Pregnancy Delivered                                                                                                Post partum procedures:rhogam  Augmentation: pitocin  Complications: Intrauterine Inflammation or infection (Chorioamniotis)  Hospital course:  Onset of Labor With Vaginal Delivery     26 y.o. yo [id number] at [redacted]w[redacted]d was admitted in Latent Labor on 07/25/2016. Patient had an uncomplicated labor course as follows:  Membrane Rupture Time/Date: 2:30 AM ,07/25/2016   Intrapartum Procedures: Episiotomy: None [1]                                         Lacerations:  2nd degree [3]  Patient had a delivery of a Viable infant. 07/25/2016  Information for the patient's newborn:  Julia, Mayer [id number]  Delivery Method: Vag-Spont    Pateint had an uncomplicated postpartum course.  She is ambulating, tolerating a regular diet, passing flatus, and urinating well. Patient is discharged home in stable condition on 07/27/16.   Physical exam  Vitals:   07/25/16 2215 07/26/16 0634 07/26/16 1801 07/27/16 0540  BP: 111/61 115/68 118/71 123/71  Pulse: 83 70 82 69  Resp: 18 18 18 18   Temp: 97.8 F (36.6 C) 97.4 F (36.3 C) 98 F (36.7 C) 98.1 F (36.7 C)  TempSrc: Oral Oral Oral Oral  SpO2:      Weight:      Height:       General: alert, cooperative and no distress Lochia: appropriate Uterine Fundus: firm Incision: N/A DVT Evaluation: No evidence of DVT seen on physical exam. Labs: Lab Results  Component Value Date   WBC 11.3 (H) 07/25/2016   HGB 11.1 (L) 07/25/2016   HCT 33.7 (L) 07/25/2016   MCV  85.8 07/25/2016   PLT 207 07/25/2016   CMP Latest Ref Rng & Units 06/23/2010  Glucose 70 - 99 mg/dL 119(H)  BUN 6 - 23 mg/dL 14  Creatinine 0.4 - 1.2 mg/dL 0.7  Sodium 135 - 145 mEq/L 137  Potassium 3.5 - 5.1 mEq/L 3.9  Chloride 96 - 112 mEq/L 106    Discharge instruction: per After Visit Summary and "Baby and Me Booklet".  After visit meds:  Allergies as of 07/27/2016   No Known Allergies     Medication List    STOP taking these medications   OB COMPLETE PETITE 35-5-1-200 MG Caps       Diet: routine diet  Activity: Advance as tolerated. Pelvic rest for 6 weeks.   Outpatient follow up:6 weeks Follow up Appt:No future appointments. Follow up Visit:No Follow-up  on file.  Postpartum contraception: Undecided  Newborn Data: Live born female  Birth Weight: 7 lb 12.2 oz (3520 g) APGAR: 3, 7  Baby Feeding: Breast Disposition:home with mother   07/27/2016 Starr Lake, CNM

## 2017-11-05 ENCOUNTER — Inpatient Hospital Stay (HOSPITAL_COMMUNITY)
Admission: AD | Admit: 2017-11-05 | Discharge: 2017-11-06 | Discharge status: Against Medical Advice | Payer: BLUE CROSS/BLUE SHIELD | Source: Ambulatory Visit | Attending: Obstetrics & Gynecology | Admitting: Obstetrics & Gynecology

## 2017-11-05 ENCOUNTER — Encounter (HOSPITAL_COMMUNITY): Payer: Self-pay

## 2017-11-05 DIAGNOSIS — N939 Abnormal uterine and vaginal bleeding, unspecified: Secondary | ICD-10-CM | POA: Diagnosis present

## 2017-11-05 DIAGNOSIS — Z5321 Procedure and treatment not carried out due to patient leaving prior to being seen by health care provider: Secondary | ICD-10-CM | POA: Insufficient documentation

## 2017-11-05 LAB — URINALYSIS, ROUTINE W REFLEX MICROSCOPIC
Bilirubin Urine: NEGATIVE
Glucose, UA: NEGATIVE mg/dL
Ketones, ur: 5 mg/dL — AB
NITRITE: NEGATIVE
PROTEIN: 100 mg/dL — AB
RBC / HPF: >50 RBC/hpf — ABNORMAL HIGH (ref 0–5)
SPECIFIC GRAVITY, URINE: 1.031 — AB (ref 1.005–1.030)
pH: 6 (ref 5.0–8.0)

## 2017-11-05 LAB — POCT PREGNANCY, URINE: Preg Test, Ur: NEGATIVE

## 2017-11-05 NOTE — MAU Note (Signed)
Pt states she started having vaginal bleeding yesterday. States she is unsure if this is her normal period. States this is about a week early. States she started having cramping yesterday as well. Pt states she took Tylenol last night-did not help, none today. Rates 9/10. States she did have unprotected sex on 10-25-17. Is not on birth control. Had a -upt at home yesterday.

## 2017-11-06 NOTE — MAU Note (Signed)
Pt not in lobby.  

## 2018-01-14 ENCOUNTER — Emergency Department (HOSPITAL_COMMUNITY)
Admission: EM | Admit: 2018-01-14 | Discharge: 2018-01-14 | Disposition: A | Discharge status: Home / Self Care | Payer: BLUE CROSS/BLUE SHIELD | Attending: Emergency Medicine | Admitting: Emergency Medicine

## 2018-01-14 ENCOUNTER — Other Ambulatory Visit: Payer: Self-pay

## 2018-01-14 ENCOUNTER — Encounter (HOSPITAL_COMMUNITY): Payer: Self-pay | Admitting: *Deleted

## 2018-01-14 DIAGNOSIS — L0291 Cutaneous abscess, unspecified: Secondary | ICD-10-CM

## 2018-01-14 DIAGNOSIS — L02215 Cutaneous abscess of perineum: Secondary | ICD-10-CM | POA: Insufficient documentation

## 2018-01-14 DIAGNOSIS — N9089 Other specified noninflammatory disorders of vulva and perineum: Secondary | ICD-10-CM | POA: Diagnosis present

## 2018-01-14 MED ORDER — LIDOCAINE-EPINEPHRINE (PF) 2 %-1:200000 IJ SOLN
20.0000 mL | Freq: Once | INTRAMUSCULAR | Status: AC
Start: 2018-01-14 — End: 2018-01-14
  Administered 2018-01-14: 20 mL
  Filled 2018-01-14: qty 20

## 2018-01-14 MED ORDER — DOXYCYCLINE HYCLATE 100 MG PO CAPS: 100.0000 mg | ORAL_CAPSULE | Freq: Two times a day (BID) | ORAL | 0 refills | Status: DC

## 2018-01-14 NOTE — ED Triage Notes (Signed)
Pt states ingrown hair to L labia x 3 weeks.  Area is now red and swollen and very tender.  Denies drainage.

## 2018-01-14 NOTE — Discharge Instructions (Addendum)
Take antibiotics as prescribed.  Take the entire course, even if your symptoms improve. Use Tylenol or ibuprofen as needed for pain. Keep the area covered for the rest of today.  Wash daily with soap and water, and reapply new dressing until it is no longer draining. You may contact the 1-866 number the back of the paperwork to help establish primary care. Return to the emergency room if you develop fevers, vomiting, increased pus draining from the area, or any new or concerning symptoms.

## 2018-01-14 NOTE — ED Notes (Signed)
Assisting pa

## 2018-01-14 NOTE — ED Provider Notes (Signed)
[facility] EMERGENC[facility] DEPARTMENT Provider Note   CSN: [id number] Arrival date & time: 01/14/18  0846     History   Chief Complaint Chief Complaint  Patient presents with  . Abscess    HPI Julia Mayer is a 27 y.o. female presenting for evaluation of abscess.  Patient states that she had an ingrown hair on her mons pubis, which has been causing problems in the past 3 weeks.  It intermittently drains, last for the last week.  It has become more swollen and painful.  She denies fevers, chills, nausea, vomiting.  She denies lesions elsewhere.  She has not been taking anything for pain including Tylenol or ibuprofen.  She is not immunocompromised.  Tetanus is up-to-date.  She is not on blood thinners.  HPI  Past Medical History:  Diagnosis Date  . Chlamydia   . No pertinent past medical history   . Normal pregnancy 07/19/2011  . SVD (spontaneous vaginal delivery) 07/20/2011  . Urinary tract infection during pregnancy 06/15/2011  . UTI (urinary tract infection)     Patient Active Problem List   Diagnosis Date Noted  . Pregnancy 07/25/2016  . Supervision of normal pregnancy, antepartum 05/15/2016  . Candida vaginitis 01/28/2011    Past Surgical History:  Procedure Laterality Date  . 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   . INDUCED ABORTION       OB History    Gravida  5   Para  2   Term  2   Preterm  0   AB  3   Living  2     SAB  0   TAB  3   Ectopic  0   Multiple  0   Live Births  2            Home Medications    Prior to Admission medications   Medication Sig Start Date End Date Taking? Authorizing Provider  doxycycline (VIBRAMYCIN) 100 MG capsule Take 1 capsule (100 mg total) by mouth 2 (two) times daily. 01/14/18   Kiari Hosmer, PA-C    Family History Family History  Problem Relation Age of Onset  . Hypertension Mother   . Asthma Mother   . Alcohol abuse Mother   . Hypertension Father   . Hyperlipidemia  Father   . Cancer Maternal Grandmother   . Cancer Paternal Grandmother   . Anesthesia problems Paternal Grandmother     Social History Social History   Tobacco Use  . Smoking status: Never Smoker  . Smokeless tobacco: Never Used  Substance Use Topics  . Alcohol use: No    Comment: occasionally but not during pregnancy  . Drug use: No     Allergies   Patient has no known allergies.   Review of Systems Review of Systems  Constitutional: Negative for fever.  Skin: Positive for color change.  Hematological: Does not bruise/bleed easily.     Physical Exam Updated Vital Signs BP 126/81 (BP Location: Right Arm)   Pulse 74   Temp 98.4 F (36.9 C) (Oral)   Resp 17   Ht 5\' 2"  (1.575 m)   Wt 84.4 kg (186 lb)   LMP 12/24/2017   SpO2 100%   BMI 34.02 kg/m   Physical Exam  Constitutional: She is oriented to person, place, and time. She appears well-developed and well-nourished. No distress.  HENT:  Head: Normocephalic and atraumatic.  Eyes: EOM are normal.  Neck: Normal range  of motion.  Pulmonary/Chest: Effort normal.  Abdominal: She exhibits no distension.  Genitourinary:     Genitourinary Comments: Tender abscess on the mons pubis without active drainage.  Musculoskeletal: Normal range of motion.  Neurological: She is alert and oriented to person, place, and time.  Skin: Skin is warm. No rash noted.  Psychiatric: She has a normal mood and affect.  Nursing note and vitals reviewed.    ED Treatments / Results  Labs (all labs ordered are listed, but only abnormal results are displayed) Labs Reviewed - No data to display  EKG None  Radiology No results found.  Procedures .Marland KitchenIncision and Drainage Date/Time: 01/14/2018 11:05 AM Performed by: Franchot Heidelberg, PA-C Authorized by: Franchot Heidelberg, PA-C   Consent:    Consent obtained:  Verbal   Consent given by:  Patient   Risks discussed:  Bleeding, incomplete drainage, infection and pain Location:     Type:  Abscess   Location:  Anogenital   Anogenital location:  Vulva Pre-procedure details:    Skin preparation:  Chloraprep Anesthesia (see MAR for exact dosages):    Anesthesia method:  Local infiltration   Local anesthetic:  Lidocaine 2% WITH epi Procedure type:    Complexity:  Simple Procedure details:    Incision types:  Single straight   Incision depth:  Dermal   Scalpel blade:  11   Wound management:  Probed and deloculated   Drainage:  Bloody and purulent   Drainage amount:  Moderate   Wound treatment:  Wound left open Post-procedure details:    Patient tolerance of procedure:  Tolerated well, no immediate complications   (including critical care time)  Medications Ordered in ED Medications  lidocaine-EPINEPHrine (XYLOCAINE W/EPI) 2 %-1:200000 (PF) injection 20 mL (20 mLs Infiltration Given 01/14/18 1037)     Initial Impression / Assessment and Plan / ED Course  I have reviewed the triage vital signs and the nursing notes.  Pertinent labs & imaging results that were available during my care of the patient were reviewed by me and considered in my medical decision making (see chart for details).     Patient presenting for evaluation of abscess.  Physical exam consistent with abscess on the mons pubis without active drainage.  I&D performed, purulent material expressed.  Aftercare instructions given.  As abscess is near the crease between mons pubis and leg, will start on doxycycline, as it may be difficult to continue to drain.  At this time, patient appears safe for discharge.  Return precautions given.  Patient states she understands and agrees plan.   Final Clinical Impressions(s) / ED Diagnoses   Final diagnoses:  Abscess    ED Discharge Orders        Ordered    doxycycline (VIBRAMYCIN) 100 MG capsule  2 times daily     01/14/18 1059       Aryiana Klinkner, PA-C 01/14/18 1106    Milton Ferguson, MD 01/14/18 1529

## 2018-05-05 ENCOUNTER — Other Ambulatory Visit: Payer: Self-pay

## 2018-05-05 ENCOUNTER — Emergency Department (HOSPITAL_COMMUNITY)
Admission: EM | Admit: 2018-05-05 | Discharge: 2018-05-05 | Discharge status: Against Medical Advice | Payer: BLUE CROSS/BLUE SHIELD

## 2018-05-05 NOTE — ED Notes (Signed)
No answer for triage x2 

## 2018-05-05 NOTE — ED Notes (Signed)
No answer x3

## 2018-05-06 ENCOUNTER — Emergency Department (HOSPITAL_COMMUNITY)
Admission: EM | Admit: 2018-05-06 | Discharge: 2018-05-07 | Disposition: A | Discharge status: Home / Self Care | Payer: BLUE CROSS/BLUE SHIELD | Attending: Emergency Medicine | Admitting: Emergency Medicine

## 2018-05-06 DIAGNOSIS — R1011 Right upper quadrant pain: Secondary | ICD-10-CM

## 2018-05-06 DIAGNOSIS — K8020 Calculus of gallbladder without cholecystitis without obstruction: Secondary | ICD-10-CM

## 2018-05-06 DIAGNOSIS — R1084 Generalized abdominal pain: Secondary | ICD-10-CM | POA: Diagnosis present

## 2018-05-06 NOTE — ED Triage Notes (Signed)
Pt from home. Onset of abdominal pain radiating to back x 1 week. No urinary symptoms. No constipation. Pt had a brief period of nausea earlier with no vomiting. No fever or chills.

## 2018-05-07 ENCOUNTER — Emergency Department (HOSPITAL_COMMUNITY): Payer: BLUE CROSS/BLUE SHIELD

## 2018-05-07 ENCOUNTER — Encounter (HOSPITAL_COMMUNITY): Payer: Self-pay | Admitting: Emergency Medicine

## 2018-05-07 ENCOUNTER — Other Ambulatory Visit: Payer: Self-pay

## 2018-05-07 LAB — CBC
HEMATOCRIT: 38.1 % (ref 36.0–46.0)
HEMOGLOBIN: 11.5 g/dL — AB (ref 12.0–15.0)
MCH: 27.2 pg (ref 26.0–34.0)
MCHC: 30.2 g/dL (ref 30.0–36.0)
MCV: 90.1 fL (ref 80.0–100.0)
NRBC: 0 % (ref 0.0–0.2)
Platelets: 324 10*3/uL (ref 150–400)
RBC: 4.23 MIL/uL (ref 3.87–5.11)
RDW: 13.2 % (ref 11.5–15.5)
WBC: 10.4 10*3/uL (ref 4.0–10.5)

## 2018-05-07 LAB — COMPREHENSIVE METABOLIC PANEL
ALT: 11 U/L (ref 0–44)
AST: 13 U/L — ABNORMAL LOW (ref 15–41)
Albumin: 3.3 g/dL — ABNORMAL LOW (ref 3.5–5.0)
Alkaline Phosphatase: 65 U/L (ref 38–126)
Anion gap: 6 (ref 5–15)
BUN: 10 mg/dL (ref 6–20)
CHLORIDE: 106 mmol/L (ref 98–111)
CO2: 26 mmol/L (ref 22–32)
CREATININE: 0.65 mg/dL (ref 0.44–1.00)
Calcium: 8.9 mg/dL (ref 8.9–10.3)
GFR calc non Af Amer: >60 mL/min (ref >60)
Glucose, Bld: 107 mg/dL — ABNORMAL HIGH (ref 70–99)
POTASSIUM: 3.7 mmol/L (ref 3.5–5.1)
Sodium: 138 mmol/L (ref 135–145)
Total Bilirubin: 0.4 mg/dL (ref 0.3–1.2)
Total Protein: 6.6 g/dL (ref 6.5–8.1)

## 2018-05-07 LAB — LIPASE, BLOOD: LIPASE: 28 U/L (ref 11–51)

## 2018-05-07 LAB — URINALYSIS, ROUTINE W REFLEX MICROSCOPIC
BILIRUBIN URINE: NEGATIVE
Glucose, UA: NEGATIVE mg/dL
HGB URINE DIPSTICK: NEGATIVE
Ketones, ur: NEGATIVE mg/dL
NITRITE: NEGATIVE
Protein, ur: NEGATIVE mg/dL
Specific Gravity, Urine: 1.02 (ref 1.005–1.030)
pH: 6 (ref 5.0–8.0)

## 2018-05-07 LAB — I-STAT BETA HCG BLOOD, ED (MC, WL, AP ONLY)

## 2018-05-07 MED ORDER — IOHEXOL 300 MG/ML  SOLN
100.0000 mL | Freq: Once | INTRAMUSCULAR | Status: AC | PRN
  Administered 2018-05-07: 100 mL via INTRAVENOUS

## 2018-05-07 NOTE — ED Provider Notes (Signed)
[facility] EMERGENCY DEPARTMENT Provider Note   CSN: [id number] Arrival date & time: 05/06/18  2330     History   Chief Complaint Chief Complaint  Patient presents with  . Abdominal Pain    HPI Julia Mayer is a 27 y.o. female.  Patient is a 27 year old female with history of prior childbirth times 2 presenting with complaints of abdominal discomfort.  This is been ongoing for many months, but has worsened over the past several days.  She reports some cramping throughout her lower abdomen with some radiation into her back.  She has not had a bowel movement in the last 2 days, but states that this is not unusual while she is on her period.  She denies any fevers or chills.  She denies any urinary complaints.  The history is provided by the patient.  Abdominal Pain   This is a recurrent problem. Episode onset: Worsened a few days ago. The problem occurs constantly. The problem has been gradually worsening. The pain is associated with an unknown factor. The pain is located in the generalized abdominal region. The quality of the pain is cramping. The pain is moderate. Pertinent negatives include fever, flatus, hematochezia and dysuria.    Past Medical History:  Diagnosis Date  . Chlamydia   . No pertinent past medical history   . Normal pregnancy 07/19/2011  . SVD (spontaneous vaginal delivery) 07/20/2011  . Urinary tract infection during pregnancy 06/15/2011  . UTI (urinary tract infection)     Patient Active Problem List   Diagnosis Date Noted  . Pregnancy 07/25/2016  . Supervision of normal pregnancy, antepartum 05/15/2016  . Candida vaginitis 01/28/2011    Past Surgical History:  Procedure Laterality Date  . 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   . INDUCED ABORTION       OB History    Gravida  5   Para  2   Term  2   Preterm  0   AB  3   Living  2     SAB  0   TAB  3   Ectopic  0   Multiple  0   Live Births  2             Home Medications    Prior to Admission medications   Medication Sig Start Date End Date Taking? Authorizing Provider  doxycycline (VIBRAMYCIN) 100 MG capsule Take 1 capsule (100 mg total) by mouth 2 (two) times daily. 01/14/18   Caccavale, Sophia, PA-C    Family History Family History  Problem Relation Age of Onset  . Hypertension Mother   . Asthma Mother   . Alcohol abuse Mother   . Hypertension Father   . Hyperlipidemia Father   . Cancer Maternal Grandmother   . Cancer Paternal Grandmother   . Anesthesia problems Paternal Grandmother     Social History Social History   Tobacco Use  . Smoking status: Never Smoker  . Smokeless tobacco: Never Used  Substance Use Topics  . Alcohol use: No    Comment: occasionally but not during pregnancy  . Drug use: No     Allergies   Patient has no known allergies.   Review of Systems Review of Systems  Constitutional: Negative for fever.  Gastrointestinal: Positive for abdominal pain. Negative for flatus and hematochezia.  Genitourinary: Negative for dysuria.  All other systems reviewed and are negative.    Physical Exam Updated Vital Signs  BP 125/90 (BP Location: Right Arm)   Pulse 75   Temp 97.9 F (36.6 C) (Oral)   Resp 18   SpO2 99%   Physical Exam  Constitutional: She is oriented to person, place, and time. She appears well-developed and well-nourished. No distress.  HENT:  Head: Normocephalic and atraumatic.  Neck: Normal range of motion. Neck supple.  Cardiovascular: Normal rate and regular rhythm. Exam reveals no gallop and no friction rub.  No murmur heard. Pulmonary/Chest: Effort normal and breath sounds normal. No respiratory distress. She has no wheezes.  Abdominal: Soft. Bowel sounds are normal. She exhibits no distension. There is generalized tenderness. There is no rigidity, no rebound and no guarding.  There is mild generalized abdominal tenderness.  Musculoskeletal: Normal range of motion.   Neurological: She is alert and oriented to person, place, and time.  Skin: Skin is warm and dry. She is not diaphoretic.  Nursing note and vitals reviewed.    ED Treatments / Results  Labs (all labs ordered are listed, but only abnormal results are displayed) Labs Reviewed  COMPREHENSIVE METABOLIC PANEL - Abnormal; Notable for the following components:      Result Value   Glucose, Bld 107 (*)    Albumin 3.3 (*)    AST 13 (*)    All other components within normal limits  CBC - Abnormal; Notable for the following components:   Hemoglobin 11.5 (*)    All other components within normal limits  URINALYSIS, ROUTINE W REFLEX MICROSCOPIC - Abnormal; Notable for the following components:   APPearance HAZY (*)    Leukocytes, UA MODERATE (*)    Bacteria, UA RARE (*)    All other components within normal limits  LIPASE, BLOOD  I-STAT BETA HCG BLOOD, ED ([facility], [facility], [facility] ONLY)    EKG None  Radiology No results found.  Procedures Procedures (including critical care time)  Medications Ordered in ED Medications - No data to display   Initial Impression / Assessment and Plan / ED Course  I have reviewed the triage vital signs and the nursing notes.  Pertinent labs & imaging results that were available during my care of the patient were reviewed by me and considered in my medical decision making (see chart for details).  Patient is a 27 year old female with intermittent crampy abdominal pain that is been ongoing for quite some time.  Her laboratory studies are reassuring and CT scan shows possible gallbladder thickening.  An ultrasound was obtained at the recommendation of radiology which showed cholelithiasis, however no evidence for acute cholecystitis.  I am uncertain as to whether the gallbladder or gallstone is the issue causing her symptoms, but will refer her to general surgery to discuss.  She may need a HIDA scan in the future.  Nothing else appears emergent tonight and I believe  she is appropriate for discharge.  Final Clinical Impressions(s) / ED Diagnoses   Final diagnoses:  None    ED Discharge Orders    None       Veryl Speak, MD 05/07/18 0401

## 2018-05-07 NOTE — ED Notes (Signed)
Patient transported to CT 

## 2018-05-07 NOTE — Discharge Instructions (Addendum)
Follow-up with general surgery in the next week.  The contact information for [facility] surgery has been provided in this discharge summary for you to call and make these arrangements.  Return to the emergency department in the meantime if you develop worsening pain, high fevers, bloody stools, or other new and concerning symptoms.

## 2018-05-07 NOTE — ED Notes (Signed)
Patient transported to Ultrasound 

## 2018-05-07 NOTE — ED Notes (Signed)
ED Provider at bedside. 

## 2018-05-18 ENCOUNTER — Other Ambulatory Visit (HOSPITAL_COMMUNITY): Payer: Self-pay | Admitting: General Surgery

## 2018-05-18 ENCOUNTER — Other Ambulatory Visit: Payer: Self-pay | Admitting: General Surgery

## 2018-05-18 DIAGNOSIS — K862 Cyst of pancreas: Secondary | ICD-10-CM

## 2018-05-18 DIAGNOSIS — D1803 Hemangioma of intra-abdominal structures: Secondary | ICD-10-CM

## 2018-05-19 ENCOUNTER — Other Ambulatory Visit: Payer: BLUE CROSS/BLUE SHIELD

## 2018-05-19 ENCOUNTER — Ambulatory Visit (HOSPITAL_COMMUNITY)
Admission: RE | Admit: 2018-05-19 | Discharge: 2018-05-19 | Disposition: A | Discharge status: Home / Self Care | Payer: BLUE CROSS/BLUE SHIELD | Source: Ambulatory Visit | Attending: General Surgery | Admitting: General Surgery

## 2018-05-19 DIAGNOSIS — D1803 Hemangioma of intra-abdominal structures: Secondary | ICD-10-CM | POA: Diagnosis not present

## 2018-05-19 DIAGNOSIS — K862 Cyst of pancreas: Secondary | ICD-10-CM | POA: Insufficient documentation

## 2018-05-19 DIAGNOSIS — K8020 Calculus of gallbladder without cholecystitis without obstruction: Secondary | ICD-10-CM | POA: Diagnosis not present

## 2018-05-19 MED ORDER — GADOBUTROL 1 MMOL/ML IV SOLN
8.0000 mL | Freq: Once | INTRAVENOUS | Status: AC | PRN
  Administered 2018-05-19: 8 mL via INTRAVENOUS

## 2018-05-21 ENCOUNTER — Telehealth: Payer: Self-pay

## 2018-05-21 NOTE — Telephone Encounter (Signed)
-----   Message from Milus Banister, MD sent at 05/21/2018  7:25 AM EST ----- Renelda Loma,  Happy to see her.  We'll reach out from our end to expedite.  Thanks Adrienne Mocha, She needs upper EUS, MAC, radial +/- linear, my next available EUS Thursday for abnormal pancreas, ?cyst  Thanks  ----- Message ----- From: Fanny Skates, MD Sent: 05/21/2018   5:47 AM EST To: Milus Banister, MD, Fanny Skates, MD  Linna Hoff,  I am referring this 27 y.o. Woman patient to you for consideration of EGD/EUS/possible needle biopsy of indistinct pancreatic mass that includes IPMN in differential according to radiology.  She has gallstones, is clearly symptimatic from that, and will need cholecystectomy. Gallbladder symptoms for 6 years according to her.Seen in ED where labs are normal. Has had [place], CT and now MRI. Has liver hemangioma but pancreas shows 3.6 cm cyst on Ct, possible IPMN. MRI shows some vague but focal findings in the pancreatic body. Radiology hedged.  I talked to her and she prefers to settle this issue before cholecystectomy rather than follow and work up later.  I look forward to your opinion.  Thanks, [employer]

## 2018-05-21 NOTE — Telephone Encounter (Signed)
Left message on machine to call back  

## 2018-05-22 NOTE — Telephone Encounter (Signed)
Pt is returning your call  [phone]

## 2018-05-25 NOTE — Telephone Encounter (Signed)
I spoke with the pt and she is aware I will call her tomorrow with the appt date and time for EUS

## 2018-05-25 NOTE — Telephone Encounter (Signed)
Pt calling regarding this referral, pls call her.

## 2018-05-26 ENCOUNTER — Other Ambulatory Visit: Payer: Self-pay

## 2018-05-26 DIAGNOSIS — R935 Abnormal findings on diagnostic imaging of other abdominal regions, including retroperitoneum: Secondary | ICD-10-CM

## 2018-05-26 NOTE — Telephone Encounter (Signed)
Looks like 12/5 at 11:30 is still open. Can she take that appt?

## 2018-05-26 NOTE — Telephone Encounter (Signed)
EUS scheduled, pt instructed and medications reviewed.  Patient instructions mailed to home.  Patient to call with any questions or concerns.  

## 2018-05-26 NOTE — Telephone Encounter (Signed)
Dr Ardis Hughs 12/19 is your first available.  Is this too far out?  Dr Rush Landmark has 12/2.  Would you like for me to schedule with Dr Jerilynn Mages?

## 2018-06-03 ENCOUNTER — Other Ambulatory Visit: Payer: Self-pay | Admitting: General Surgery

## 2018-06-09 ENCOUNTER — Encounter (HOSPITAL_COMMUNITY): Payer: Self-pay | Admitting: *Deleted

## 2018-06-09 ENCOUNTER — Other Ambulatory Visit: Payer: Self-pay

## 2018-06-11 ENCOUNTER — Encounter (HOSPITAL_COMMUNITY)
Admission: RE | Disposition: A | Discharge status: Home / Self Care | Payer: Self-pay | Source: Ambulatory Visit | Attending: Gastroenterology

## 2018-06-11 ENCOUNTER — Ambulatory Visit (HOSPITAL_COMMUNITY): Payer: BLUE CROSS/BLUE SHIELD | Admitting: Anesthesiology

## 2018-06-11 ENCOUNTER — Other Ambulatory Visit: Payer: Self-pay

## 2018-06-11 ENCOUNTER — Ambulatory Visit (HOSPITAL_COMMUNITY)
Admission: RE | Admit: 2018-06-11 | Discharge: 2018-06-11 | Disposition: A | Discharge status: Home / Self Care | Payer: BLUE CROSS/BLUE SHIELD | Source: Ambulatory Visit | Attending: Gastroenterology | Admitting: Gastroenterology

## 2018-06-11 ENCOUNTER — Encounter (HOSPITAL_COMMUNITY): Payer: Self-pay | Admitting: Emergency Medicine

## 2018-06-11 DIAGNOSIS — Z6833 Body mass index (BMI) 33.0-33.9, adult: Secondary | ICD-10-CM | POA: Diagnosis not present

## 2018-06-11 DIAGNOSIS — R591 Generalized enlarged lymph nodes: Secondary | ICD-10-CM | POA: Diagnosis not present

## 2018-06-11 DIAGNOSIS — K862 Cyst of pancreas: Secondary | ICD-10-CM | POA: Diagnosis not present

## 2018-06-11 DIAGNOSIS — R935 Abnormal findings on diagnostic imaging of other abdominal regions, including retroperitoneum: Secondary | ICD-10-CM | POA: Diagnosis not present

## 2018-06-11 DIAGNOSIS — K8020 Calculus of gallbladder without cholecystitis without obstruction: Secondary | ICD-10-CM | POA: Insufficient documentation

## 2018-06-11 HISTORY — PX: EUS: SHX5427

## 2018-06-11 HISTORY — PX: FINE NEEDLE ASPIRATION: SHX5430

## 2018-06-11 HISTORY — PX: ESOPHAGOGASTRODUODENOSCOPY: SHX5428

## 2018-06-11 SURGERY — UPPER ENDOSCOPIC ULTRASOUND (EUS) RADIAL
Anesthesia: Monitor Anesthesia Care

## 2018-06-11 MED ORDER — CIPROFLOXACIN HCL 500 MG PO TABS: 500.0000 mg | ORAL_TABLET | Freq: Two times a day (BID) | ORAL | 0 refills | Status: DC

## 2018-06-11 MED ORDER — CIPROFLOXACIN IN D5W 400 MG/200ML IV SOLN
INTRAVENOUS | Status: AC
  Filled 2018-06-11: qty 200

## 2018-06-11 MED ORDER — PROPOFOL 500 MG/50ML IV EMUL
INTRAVENOUS | Status: DC | PRN
  Administered 2018-06-11: 300 ug/kg/min via INTRAVENOUS

## 2018-06-11 MED ORDER — SODIUM CHLORIDE 0.9 % IV SOLN: INTRAVENOUS | Status: DC

## 2018-06-11 MED ORDER — PROPOFOL 10 MG/ML IV BOLUS
INTRAVENOUS | Status: AC
  Filled 2018-06-11: qty 40

## 2018-06-11 MED ORDER — CIPROFLOXACIN IN D5W 400 MG/200ML IV SOLN: 400.0000 mg | Freq: Once | INTRAVENOUS | Status: DC

## 2018-06-11 MED ORDER — LIDOCAINE HCL (CARDIAC) PF 100 MG/5ML IV SOSY
PREFILLED_SYRINGE | INTRAVENOUS | Status: DC | PRN
  Administered 2018-06-11: 100 mg via INTRAVENOUS

## 2018-06-11 MED ORDER — GLYCOPYRROLATE 0.2 MG/ML IJ SOLN
INTRAMUSCULAR | Status: DC | PRN
  Administered 2018-06-11: 0.1 mg via INTRAVENOUS

## 2018-06-11 MED ORDER — LACTATED RINGERS IV SOLN
INTRAVENOUS | Status: DC
  Administered 2018-06-11: 11:00:00 via INTRAVENOUS

## 2018-06-11 MED ORDER — PROPOFOL 10 MG/ML IV BOLUS
INTRAVENOUS | Status: AC
  Filled 2018-06-11: qty 20

## 2018-06-11 NOTE — Anesthesia Preprocedure Evaluation (Addendum)
Anesthesia Evaluation  Patient identified by MRN, date of birth, ID band Patient awake    Reviewed: Allergy & Precautions, NPO status , Patient's Chart, lab work & pertinent test results  History of Anesthesia Complications Negative for: history of anesthetic complications  Airway Mallampati: I  TM Distance: >3 FB Neck ROM: Full    Dental  (+) Chipped, Dental Advisory Given   Pulmonary neg pulmonary ROS,    breath sounds clear to auscultation       Cardiovascular negative cardio ROS   Rhythm:Regular Rate:Normal     Neuro/Psych negative neurological ROS     GI/Hepatic negative GI ROS, Symptomatic gallstones   Endo/Other  Morbid obesityobese  Renal/GU negative Renal ROS     Musculoskeletal   Abdominal (+) + obese,   Peds  Hematology negative hematology ROS (+)   Anesthesia Other Findings   Reproductive/Obstetrics 05/07/18 preg test NEG                            Anesthesia Physical Anesthesia Plan  ASA: II  Anesthesia Plan: MAC   Post-op Pain Management:    Induction:   PONV Risk Score and Plan: 2 and Treatment may vary due to age or medical condition  Airway Management Planned: Natural Airway and Nasal Cannula  Additional Equipment:   Intra-op Plan:   Post-operative Plan:   Informed Consent: I have reviewed the patients History and Physical, chart, labs and discussed the procedure including the risks, benefits and alternatives for the proposed anesthesia with the patient or authorized representative who has indicated his/her understanding and acceptance.   Dental advisory given  Plan Discussed with: CRNA and Surgeon  Anesthesia Plan Comments: (Plan routine monitors, MAC)        Anesthesia Quick Evaluation

## 2018-06-11 NOTE — Anesthesia Postprocedure Evaluation (Signed)
Anesthesia Post Note  Patient: Julia Mayer  Procedure(s) Performed: UPPER ENDOSCOPIC ULTRASOUND (EUS) RADIAL (N/A ) FINE NEEDLE ASPIRATION (FNA) LINEAR (N/A )     Patient location during evaluation: Endoscopy Anesthesia Type: MAC Level of consciousness: awake and alert, oriented and patient cooperative Pain management: pain level controlled Vital Signs Assessment: post-procedure vital signs reviewed and stable Respiratory status: spontaneous breathing, nonlabored ventilation and respiratory function stable Cardiovascular status: blood pressure returned to baseline and stable Postop Assessment: no apparent nausea or vomiting Anesthetic complications: no    Last Vitals:  Vitals:   06/11/18 0944 06/11/18 1158  BP: (!) 146/90 126/77  Pulse: 74 73  Resp: 19 (!) 22  Temp: 36.8 C 36.4 C  SpO2: 99% 100%    Last Pain:  Vitals:   06/11/18 1158  TempSrc: Oral  PainSc: 0-No pain                 Tarisa Paola,E. Hadlyn Amero

## 2018-06-11 NOTE — H&P (Signed)
HPI: This is a very pleasant 27 yo woman  Chief complaint is cyst/fluid collection near pancreas  ROS: complete GI ROS as described in HPI, all other review negative.  Constitutional:  No unintentional weight loss   Past Medical History:  Diagnosis Date  . Chlamydia   . No pertinent past medical history   . Normal pregnancy 07/19/2011  . SVD (spontaneous vaginal delivery) 07/20/2011  . Urinary tract infection during pregnancy 06/15/2011  . UTI (urinary tract infection)     Past Surgical History:  Procedure Laterality Date  . 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   . INDUCED ABORTION      Current Facility-Administered Medications  Medication Dose Route Frequency Provider Last Rate Last Dose  . 0.9 %  sodium chloride infusion   Intravenous Continuous Milus Banister, MD      . lactated ringers infusion   Intravenous Continuous Milus Banister, MD        Allergies as of 05/26/2018  . (No Known Allergies)    Family History  Problem Relation Age of Onset  . Hypertension Mother   . Asthma Mother   . Alcohol abuse Mother   . Hypertension Father   . Hyperlipidemia Father   . Cancer Maternal Grandmother   . Cancer Paternal Grandmother   . Anesthesia problems Paternal Grandmother     Social History   Socioeconomic History  . Marital status: Single    Spouse name: Not on file  . Number of children: Not on file  . Years of education: Not on file  . Highest education level: Not on file  Occupational History  . Not on file  Social Needs  . Financial resource strain: Not on file  . Food insecurity:    Worry: Not on file    Inability: Not on file  . Transportation needs:    Medical: Not on file    Non-medical: Not on file  Tobacco Use  . Smoking status: Never Smoker  . Smokeless tobacco: Never Used  Substance and Sexual Activity  . Alcohol use: No    Comment: occasionally but not during pregnancy  . Drug use: No  . Sexual activity: Yes    Birth  control/protection: None  Lifestyle  . Physical activity:    Days per week: Not on file    Minutes per session: Not on file  . Stress: Not on file  Relationships  . Social connections:    Talks on phone: Not on file    Gets together: Not on file    Attends religious service: Not on file    Active member of club or organization: Not on file    Attends meetings of clubs or organizations: Not on file    Relationship status: Not on file  . Intimate partner violence:    Fear of current or ex partner: Not on file    Emotionally abused: Not on file    Physically abused: Not on file    Forced sexual activity: Not on file  Other Topics Concern  . Not on file  Social History Narrative  . Not on file     Physical Exam: BP (!) 146/90   Pulse 74   Temp 98.2 F (36.8 C) (Oral)   Resp 19   Ht 5\' 2"  (1.575 m)   Wt 83.5 kg   LMP 05/24/2018   SpO2 99%   BMI 33.65 kg/m  Constitutional: generally well-appearing Psychiatric: alert and oriented  x3 Abdomen: soft, nontender, nondistended, no obvious ascites, no peritoneal signs, normal bowel sounds No peripheral edema noted in lower extremities  Assessment and plan: 27 y.o. female with cyst/fluid collection near pancreas  For upper EUS evaluation today  Please see the "Patient Instructions" section for addition details about the plan.  Owens Loffler, MD [facility] 06/11/2018, 10:27 AM

## 2018-06-11 NOTE — Anesthesia Procedure Notes (Signed)
Procedure Name: MAC Date/Time: 06/11/2018 11:26 AM Performed by: Lissa Morales, CRNA Pre-anesthesia Checklist: Patient identified, Emergency Drugs available, Suction available, Patient being monitored and Timeout performed Patient Re-evaluated:Patient Re-evaluated prior to induction Oxygen Delivery Method: Nasal cannula Placement Confirmation: positive ETCO2

## 2018-06-11 NOTE — Transfer of Care (Signed)
Immediate Anesthesia Transfer of Care Note  Patient: Julia Mayer  Procedure(s) Performed: UPPER ENDOSCOPIC ULTRASOUND (EUS) RADIAL (N/A ) FINE NEEDLE ASPIRATION (FNA) LINEAR (N/A )  Patient Location: PACU  Anesthesia Type:MAC  Level of Consciousness: awake, alert , oriented and patient cooperative  Airway & Oxygen Therapy: Patient Spontanous Breathing and Patient connected to nasal cannula oxygen  Post-op Assessment: Report given to RN, Post -op Vital signs reviewed and stable and Patient moving all extremities X 4  Post vital signs: stable  Last Vitals:  Vitals Value Taken Time  BP 126/77 06/11/2018 12:02 PM  Temp 36.4 C 06/11/2018 11:58 AM  Pulse 80 06/11/2018 12:07 PM  Resp 17 06/11/2018 12:07 PM  SpO2 100 % 06/11/2018 12:07 PM  Vitals shown include unvalidated device data.  Last Pain:  Vitals:   06/11/18 1158  TempSrc: Oral  PainSc: 0-No pain         Complications: No apparent anesthesia complications

## 2018-06-11 NOTE — Op Note (Signed)
[facility] Patient Name: Julia Mayer Procedure Date: 06/11/2018 MRN: [id number] Attending MD: Milus Banister , MD Date of Birth: 06/16/1991 CSN: [id number] Age: 27 Admit Type: Outpatient Procedure:                Upper EUS Indications:              Incidental cyst/fluid collection near the pancreas Providers:                Milus Banister, MD, Cleda Daub, RN, Charolette Child, Technician, Enrigue Catena, CRNA Referring MD:             Fanny Skates, MD Medicines:                Monitored Anesthesia Care, cipro 400mg  IV Complications:            No immediate complications. Estimated blood loss:                            None. Estimated Blood Loss:     Estimated blood loss: none. Procedure:                Pre-Anesthesia Assessment:                           - Prior to the procedure, a History and Physical                            was performed, and patient medications and                            allergies were reviewed. The patient's tolerance of                            previous anesthesia was also reviewed. The risks                            and benefits of the procedure and the sedation                            options and risks were discussed with the patient.                            All questions were answered, and informed consent                            was obtained. Prior Anticoagulants: The patient has                            taken no previous anticoagulant or antiplatelet                            agents. ASA Grade Assessment: II - A patient with  mild systemic disease. After reviewing the risks                            and benefits, the patient was deemed in                            satisfactory condition to undergo the procedure.                           After obtaining informed consent, the endoscope was                            passed under direct vision. Throughout the                         procedure, the patient's blood pressure, pulse, and                            oxygen saturations were monitored continuously. The                            [id number] ([id number]) Olympus Radial EUS was                            introduced through the mouth, and advanced to the                            second part of duodenum. The upper EUS was                            accomplished without difficulty. The patient                            tolerated the procedure well. Scope In: Scope Out: Findings:      ENDOSCOPIC FINDING: :      The examined esophagus was endoscopically normal.      The entire examined stomach was endoscopically normal.      The examined duodenum was endoscopically normal.      ENDOSONOGRAPHIC FINDING: :      1. Anechoic, oblong fluid collection that abuts the posterior gastric       wall as well as the neck, body of the pancreas but does not appear to       originate from the pancreas (3.9cm long by 5mm across). There are no       associated solid masses. The fluid was near completely aspirated using a       single transgastric pass with a 22 guage FNA needle. This yielded 3cc of       thin, clear fluid that was sent for cytology.      2. Pancreatic parenchyma was normal; no signs of chronic pancreatitis or       discrete pancreatic masses.      3. No peripancreatic adenopathy.      4. Numerous shadowing gallstones in the gallbladder.      5. Normal, non-dilated CBD. Impression:               -  Anechoic, oblong fluid collection that abuts the                            posterior gastric wall as well as the neck, body of                            the pancreas but does not appear to originate from                            the pancreas (3.9cm long by 71mm across). There are                            no associated solid masses. The fluid was near                            completely aspirated using a single transgastric                             pass with a 22 guage FNA needle. This yielded 3cc                            of thin, clear fluid that was sent for cytology. I                            am not sure why this fluid collection is present                            but it is very unlikely to be a sign of anything                            serious.                           - Gallstones in the gallbladder. Moderate Sedation:      Not Applicable - Patient had care per Anesthesia. Recommendation:           - Discharge patient to home (ambulatory).                           - Await final cytology testing.                           - Please complete 3 days of twice daily cipro (new                            prescription was called in). Procedure Code(s):        --- Professional ---                           [phone], Esophagogastroduodenoscopy, flexible,                            transoral; with transendoscopic ultrasound-guided  intramural or transmural fine needle                            aspiration/biopsy(s), (includes endoscopic                            ultrasound examination limited to the esophagus,                            stomach or duodenum, and adjacent structures) Diagnosis Code(s):        --- Professional ---                           R59.1, Generalized enlarged lymph nodes CPT copyright 2018 American Medical Association. All rights reserved. The codes documented in this report are preliminary and upon coder review may  be revised to meet current compliance requirements. Milus Banister, MD 06/11/2018 11:59:42 AM This report has been signed electronically. Number of Addenda: 0

## 2018-06-11 NOTE — Discharge Instructions (Signed)
YOU HAD AN ENDOSCOPIC PROCEDURE TODAY: Refer to the procedure report and other information in the discharge instructions given to you for any specific questions about what was found during the examination. If this information does not answer your questions, please call [place] office at [phone] to clarify.   YOU SHOULD EXPECT: Some feelings of bloating in the abdomen. Passage of more gas than usual. Walking can help get rid of the air that was put into your GI tract during the procedure and reduce the bloating. If you had a lower endoscopy (such as a colonoscopy or flexible sigmoidoscopy) you may notice spotting of blood in your stool or on the toilet paper. Some abdominal soreness may be present for a day or two, also.  DIET: Your first meal following the procedure should be a light meal and then it is ok to progress to your normal diet. A half-sandwich or bowl of soup is an example of a good first meal. Heavy or fried foods are harder to digest and may make you feel nauseous or bloated. Drink plenty of fluids but you should avoid alcoholic beverages for 24 hours. If you had a esophageal dilation, please see attached instructions for diet.    ACTIVITY: Your care partner should take you home directly after the procedure. You should plan to take it easy, moving slowly for the rest of the day. You can resume normal activity the day after the procedure however YOU SHOULD NOT DRIVE, use power tools, machinery or perform tasks that involve climbing or major physical exertion for 24 hours (because of the sedation medicines used during the test).   SYMPTOMS TO REPORT IMMEDIATELY: A gastroenterologist can be reached at any hour. Please call [phone]  for any of the following symptoms:   Following upper endoscopy (EGD, EUS, ERCP, esophageal dilation) Vomiting of blood or coffee ground material  New, significant abdominal pain  New, significant chest pain or pain under the shoulder blades  Painful or  persistently difficult swallowing  New shortness of breath  Black, tarry-looking or red, bloody stools  FOLLOW UP:  If any biopsies were taken you will be contacted by phone or by letter within the next 1-3 weeks. Call [phone]  if you have not heard about the biopsies in 3 weeks.  Please also call with any specific questions about appointments or follow up tests.  

## 2018-06-12 ENCOUNTER — Encounter (HOSPITAL_COMMUNITY): Payer: Self-pay | Admitting: Gastroenterology

## 2018-06-12 NOTE — H&P (Signed)
Julia Mayer Location: [facility] Patient #: [id number] DOB: 1990/07/22 Single / Language: Cleophus Molt / Race: Black or African American Female      History of Present Illness        The patient is a 27 year old female who presents for evaluation of gall stones. This is a 27 year old female who returns. She has gallstones. She says she continues to have gallbladder attacks. She had been worked up with ultrasound, CT, and then MRI. MRI shows 2 small hepatic hemangiomas which do not require follow-up and it also shows a small fluid collection along the border of the pancreas abutting the stomach and fourth portion of the duodenum. Radiologist gave a long differential diagnosis including pancreatic pseudocyst, cystadenoma, foregut duplication, IPMN. The lesion was said to have low risk features by imaging. I have referred her to Dr. Oretha Caprice for EGD and EUS and possible needle biopsy which is scheduled for December 5. She plans to go ahead with that and wanted to clear up this problem before having her gallbladder out. She says that she continues to have pain and wants to go ahead and schedule her gallbladder surgery shortly after the EUS. That is reasonable     Past history negative except for 3 pregnancies 2 deliveries 1 therapeutic abortion UTIs. Family history mother has hypertension and alcohol problems. Father has hypertension and hyperlipidemia. One grandmother died of bile duct cancer. One grandmother is living and survived breast cancer treatment. SH - reveals she is single has 2 children lives with her aunt. 2 children. Denies tobacco. 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     We decided to schedule her for laparoscopic cholecystectomy with cholangiogram approximate 7 days after the EUS. We should have the results and any needle cytology at that time. We can always redirect her treatment plan if necessary  She has been out of work since November 12 due to MRI and  pain. ADDENDUM: EUS and cytology benign.  Allergies  No Known Drug Allergies  Allergies Reconciled   Medication History  No Current Medications Medications Reconciled  Vitals  Weight: 184.25 lb Height: 62in Body Surface Area: 1.85 m Body Mass Index: 33.7 kg/m  Temp.: 98.17F  Pulse: 112 (Regular)  BP: 130/74 (Sitting, Left Arm, Standard)       Physical Exam  General Mental Status-Alert. General Appearance-Not in acute distress. Build & Nutrition-Well nourished. Posture-Normal posture. Gait-Normal.  Head and Neck Head-normocephalic, atraumatic with no lesions or palpable masses. Trachea-midline. Thyroid Gland Characteristics - normal size and consistency and no palpable nodules.  Chest and Lung Exam Chest and lung exam reveals -on auscultation, normal breath sounds, no adventitious sounds and normal vocal resonance.  Cardiovascular Cardiovascular examination reveals -normal heart sounds, regular rate and rhythm with no murmurs and femoral artery auscultation bilaterally reveals normal pulses, no bruits, no thrills.  Abdomen Inspection Inspection of the abdomen reveals - No Hernias. Palpation/Percussion Palpation and Percussion of the abdomen reveal - Soft, Non Tender, No Rigidity (guarding), No hepatosplenomegaly and No Palpable abdominal masses. Note: Her abdominal exam is fairly benign. No tenderness. No guarding. Costal margins nontender. Benign.   Neurologic Neurologic evaluation reveals -alert and oriented x 3 with no impairment of recent or remote memory, normal attention span and ability to concentrate, normal sensation and normal coordination.  Musculoskeletal Normal Exam - Bilateral-Upper Extremity Strength Normal and Lower Extremity Strength Normal.    Assessment & Plan  GALLSTONES (K80.20)    You are scheduled for your  endoscopy and endoscopic ultrasound with Dr. Ardis Hughs on December 5 You are still having  gallbladder attacks you have been out of work since November 12 because of pain and tests  We discussed strategy for your care  We decided to schedule yourr gallbladder surgery about one week after the upper endoscopy and ultrasound so that all the test results will be back If everything looks benign we will go ahead with gallbladder surgery at that time If there is something wrong with your pancreas that needs attention then we will have to cancel gallbladder surgery and decide what to do  I discussed the indications, details, techniques, and numerous risk of gallbladder surgery with you and your family once again  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 Dalbert Batman, M.D., [facility] General and Minimally invasive Surgery Breast and Colorectal Surgery Office:   [phone] Pager:   [phone]

## 2018-06-15 NOTE — Pre-Procedure Instructions (Signed)
Julia Mayer  06/15/2018      [id number] [address], [place] [address] Phone: [phone] Fax: [phone]    Your procedure is scheduled on June 19, 2018.  Report to [facility] Admitting at 530 AM.  Call this number if you have problems the morning of surgery:  [phone]   Remember:  Do not eat after midnight.  You may drink clear liquids until 430 AM .  Clear liquids allowed are      Water, Juice (non-citric and without pulp), Clear Tea, Black Coffee only and Gatorade    Take these medicines the morning of surgery with A SIP OF WATER  Ciprofloxacin (cipro)  7 days prior to surgery STOP taking any Aspirin (unless otherwise instructed by your surgeon), Aleve, Naproxen, Ibuprofen, Motrin, Advil, Goody's, BC's, all herbal medications, fish oil, and all vitamins    Do not wear jewelry, make-up or nail polish.  Do not wear lotions, powders, or perfumes, or deodorant.  Do not shave 48 hours prior to surgery.    Do not bring valuables to the hospital.  [facility] is not responsible for any belongings or valuables.  Contacts, dentures or bridgework may not be worn into surgery.  Leave your suitcase in the car.  After surgery it may be brought to your room.  For patients admitted to the hospital, discharge time will be determined by your treatment team.  Patients discharged the day of surgery will not be allowed to drive home.    [place]- Preparing For Surgery  Before surgery, you can play an important role. Because skin is not sterile, your skin needs to be as free of germs as possible. You can reduce the number of germs on your skin by washing with CHG (chlorahexidine gluconate) Soap before surgery.  CHG is an antiseptic cleaner which kills germs and bonds with the skin to continue killing germs even after washing.    Oral Hygiene is also important to reduce  your risk of infection.  Remember - BRUSH YOUR TEETH THE MORNING OF SURGERY WITH YOUR REGULAR TOOTHPASTE  Please do not use if you have an allergy to CHG or antibacterial soaps. If your skin becomes reddened/irritated stop using the CHG.  Do not shave (including legs and underarms) for at least 48 hours prior to first CHG shower. It is OK to shave your face.  Please follow these instructions carefully.   1. Shower the NIGHT BEFORE SURGERY and the MORNING OF SURGERY with CHG.   2. If you chose to wash your hair, wash your hair first as usual with your normal shampoo.  3. After you shampoo, rinse your hair and body thoroughly to remove the shampoo.  4. Use CHG as you would any other liquid soap. You can apply CHG directly to the skin and wash gently with a scrungie or a clean washcloth.   5. Apply the CHG Soap to your body ONLY FROM THE NECK DOWN.  Do not use on open wounds or open sores. Avoid contact with your eyes, ears, mouth and genitals (private parts). Wash Face and genitals (private parts)  with your normal soap.  6. Wash thoroughly, paying special attention to the area where your surgery will be performed.  7. Thoroughly rinse your body with warm water from the neck down.  8. DO NOT shower/wash with your normal soap after using and rinsing off  the CHG Soap.  9. Pat yourself dry with a CLEAN TOWEL.  10. Wear CLEAN PAJAMAS to bed the night before surgery, wear comfortable clothes the morning of surgery  11. Place CLEAN SHEETS on your bed the night of your first shower and DO NOT SLEEP WITH PETS.  Day of Surgery:  Do not apply any deodorants/lotions.  Please wear clean clothes to the hospital/surgery center.   Remember to brush your teeth WITH YOUR REGULAR TOOTHPASTE.   Please read over the following fact sheets that you were given. Pain Booklet, Coughing and Deep Breathing and Surgical Site Infection Prevention

## 2018-06-16 ENCOUNTER — Ambulatory Visit (INDEPENDENT_AMBULATORY_CARE_PROVIDER_SITE_OTHER): Payer: BLUE CROSS/BLUE SHIELD | Admitting: Obstetrics

## 2018-06-16 ENCOUNTER — Other Ambulatory Visit: Payer: Self-pay

## 2018-06-16 ENCOUNTER — Encounter (HOSPITAL_COMMUNITY): Payer: Self-pay

## 2018-06-16 ENCOUNTER — Encounter: Payer: Self-pay | Admitting: Obstetrics

## 2018-06-16 ENCOUNTER — Other Ambulatory Visit (HOSPITAL_COMMUNITY)
Admission: RE | Admit: 2018-06-16 | Discharge: 2018-06-16 | Disposition: A | Discharge status: Home / Self Care | Payer: BLUE CROSS/BLUE SHIELD | Source: Ambulatory Visit | Attending: Obstetrics & Gynecology | Admitting: Obstetrics & Gynecology

## 2018-06-16 ENCOUNTER — Encounter (HOSPITAL_COMMUNITY)
Admission: RE | Admit: 2018-06-16 | Discharge: 2018-06-16 | Disposition: A | Discharge status: Home / Self Care | Payer: BLUE CROSS/BLUE SHIELD | Source: Ambulatory Visit | Attending: General Surgery | Admitting: General Surgery

## 2018-06-16 ENCOUNTER — Telehealth: Payer: Self-pay

## 2018-06-16 VITALS — BP 119/84 | HR 83 | Wt 184.2 lb

## 2018-06-16 DIAGNOSIS — R87611 Atypical squamous cells cannot exclude high grade squamous intraepithelial lesion on cytologic smear of cervix (ASC-H): Secondary | ICD-10-CM | POA: Diagnosis present

## 2018-06-16 DIAGNOSIS — D069 Carcinoma in situ of cervix, unspecified: Secondary | ICD-10-CM | POA: Diagnosis not present

## 2018-06-16 DIAGNOSIS — Z113 Encounter for screening for infections with a predominantly sexual mode of transmission: Secondary | ICD-10-CM | POA: Diagnosis not present

## 2018-06-16 DIAGNOSIS — Z3202 Encounter for pregnancy test, result negative: Secondary | ICD-10-CM | POA: Diagnosis not present

## 2018-06-16 DIAGNOSIS — R87619 Unspecified abnormal cytological findings in specimens from cervix uteri: Secondary | ICD-10-CM | POA: Diagnosis not present

## 2018-06-16 DIAGNOSIS — N898 Other specified noninflammatory disorders of vagina: Secondary | ICD-10-CM | POA: Diagnosis not present

## 2018-06-16 DIAGNOSIS — Z01812 Encounter for preprocedural laboratory examination: Secondary | ICD-10-CM | POA: Diagnosis present

## 2018-06-16 LAB — COMPREHENSIVE METABOLIC PANEL
ALK PHOS: 74 U/L (ref 38–126)
ALT: 15 U/L (ref 0–44)
AST: 14 U/L — ABNORMAL LOW (ref 15–41)
Albumin: 3.5 g/dL (ref 3.5–5.0)
Anion gap: 8 (ref 5–15)
BUN: 9 mg/dL (ref 6–20)
CALCIUM: 9 mg/dL (ref 8.9–10.3)
CO2: 25 mmol/L (ref 22–32)
Chloride: 105 mmol/L (ref 98–111)
Creatinine, Ser: 0.89 mg/dL (ref 0.44–1.00)
GFR calc Af Amer: >60 mL/min (ref >60)
GFR calc non Af Amer: >60 mL/min (ref >60)
Glucose, Bld: 97 mg/dL (ref 70–99)
Potassium: 4 mmol/L (ref 3.5–5.1)
SODIUM: 138 mmol/L (ref 135–145)
Total Bilirubin: 0.6 mg/dL (ref 0.3–1.2)
Total Protein: 7.1 g/dL (ref 6.5–8.1)

## 2018-06-16 LAB — CBC WITH DIFFERENTIAL/PLATELET
ABS IMMATURE GRANULOCYTES: 0.02 10*3/uL (ref 0.00–0.07)
Basophils Absolute: 0 10*3/uL (ref 0.0–0.1)
Basophils Relative: 0 %
Eosinophils Absolute: 0.2 10*3/uL (ref 0.0–0.5)
Eosinophils Relative: 2 %
HCT: 42.1 % (ref 36.0–46.0)
HEMOGLOBIN: 12.6 g/dL (ref 12.0–15.0)
Immature Granulocytes: 0 %
LYMPHS PCT: 25 %
Lymphs Abs: 2.1 10*3/uL (ref 0.7–4.0)
MCH: 27.1 pg (ref 26.0–34.0)
MCHC: 29.9 g/dL — ABNORMAL LOW (ref 30.0–36.0)
MCV: 90.5 fL (ref 80.0–100.0)
Monocytes Absolute: 0.6 10*3/uL (ref 0.1–1.0)
Monocytes Relative: 7 %
Neutro Abs: 5.4 10*3/uL (ref 1.7–7.7)
Neutrophils Relative %: 66 %
Platelets: 343 10*3/uL (ref 150–400)
RBC: 4.65 MIL/uL (ref 3.87–5.11)
RDW: 13.2 % (ref 11.5–15.5)
WBC: 8.2 10*3/uL (ref 4.0–10.5)
nRBC: 0 % (ref 0.0–0.2)

## 2018-06-16 LAB — POCT URINE PREGNANCY: Preg Test, Ur: NEGATIVE

## 2018-06-16 NOTE — Progress Notes (Signed)
PCP - Suzanna Obey, MD Cardiologist - denies  Chest x-ray - N/A EKG - N/A Stress Test - denies ECHO - denies Cardiac Cath - denies  Sleep Study - denies  Aspirin Instructions: N/A  Anesthesia review: No  Patient denies shortness of breath, fever, cough and chest pain at PAT appointment   Patient verbalized understanding of instructions that were given to them at the PAT appointment. Patient was also instructed that they will need to review over the PAT instructions again at home before surgery.

## 2018-06-16 NOTE — Progress Notes (Addendum)
Colposcopy Procedure Note  Indications: Pap smear 1 months ago showed: ASC cannot exclude high grade lesion [facility]). The prior pap results unavailable.  Prior cervical/vaginal disease: unknown. Prior cervical treatment: no treatment.  Procedure Details  The risks and benefits of the procedure and Written informed consent obtained.  A time-out was performed confirming the patient, procedure and allergy status  Speculum placed in vagina and excellent visualization of cervix achieved, cervix swabbed x 3 with acetic acid solution.  Findings: Cervix: no visible lesions, no mosaicism, no punctation and no abnormal vasculature; SCJ visualized 360 degrees without lesions, endocervical curettage performed, cervical biopsies taken at 6, 9 and 12 o'clock, specimen labelled and sent to pathology and hemostasis achieved with silver nitrate.   Vaginal inspection: normal without visible lesions. Vulvar colposcopy: vulvar colposcopy not performed.   Physical Exam   Specimens: ECC and Cervical Biopsies  Complications: none.  Plan: Specimens labelled and sent to Pathology. Will base further treatment on Pathology findings. Post biopsy instructions given to patient. Return to discuss Pathology results in 2 weeks   Shelly Bombard MD 06-16-2018.

## 2018-06-16 NOTE — Progress Notes (Signed)
Pt presents for colpo. Last pap 06/02/18 [facility]

## 2018-06-16 NOTE — Telephone Encounter (Signed)
Spoke with patient and discussed process of colpo procedure, pt asked if appt could be moved to this week because she is having surgery on 12/13, pt will come in today for procedure.

## 2018-06-18 ENCOUNTER — Other Ambulatory Visit: Payer: Self-pay | Admitting: Obstetrics

## 2018-06-18 ENCOUNTER — Encounter (HOSPITAL_COMMUNITY): Payer: Self-pay | Admitting: Anesthesiology

## 2018-06-18 NOTE — Anesthesia Preprocedure Evaluation (Addendum)
Anesthesia Evaluation  Patient identified by MRN, date of birth, ID band Patient awake    Reviewed: Allergy & Precautions, NPO status , Patient's Chart, lab work & pertinent test results  Airway Mallampati: II  TM Distance: >3 FB Neck ROM: Full  Mouth opening: Limited Mouth Opening  Dental no notable dental hx. (+) Teeth Intact   Pulmonary neg pulmonary ROS,    Pulmonary exam normal breath sounds clear to auscultation       Cardiovascular Normal cardiovascular exam Rhythm:Regular Rate:Normal     Neuro/Psych negative neurological ROS  negative psych ROS   GI/Hepatic Neg liver ROS, Cholelithiasis-symptomatic   Endo/Other  Obesity  Renal/GU negative Renal ROS  negative genitourinary   Musculoskeletal negative musculoskeletal ROS (+)   Abdominal (+) + obese,   Peds  Hematology negative hematology ROS (+)   Anesthesia Other Findings   Reproductive/Obstetrics negative OB ROS                            Anesthesia Physical Anesthesia Plan  ASA: II  Anesthesia Plan: General   Post-op Pain Management:    Induction: Intravenous and Cricoid pressure planned  PONV Risk Score and Plan: 4 or greater and Scopolamine patch - Pre-op, Midazolam, Dexamethasone, Ondansetron and Treatment may vary due to age or medical condition  Airway Management Planned: Oral ETT  Additional Equipment:   Intra-op Plan:   Post-operative Plan: Extubation in OR  Informed Consent: I have reviewed the patients History and Physical, chart, labs and discussed the procedure including the risks, benefits and alternatives for the proposed anesthesia with the patient or authorized representative who has indicated his/her understanding and acceptance.   Dental advisory given  Plan Discussed with: CRNA and Surgeon  Anesthesia Plan Comments:        Anesthesia Quick Evaluation

## 2018-06-19 ENCOUNTER — Ambulatory Visit (HOSPITAL_COMMUNITY): Payer: BLUE CROSS/BLUE SHIELD | Admitting: Anesthesiology

## 2018-06-19 ENCOUNTER — Ambulatory Visit (HOSPITAL_COMMUNITY): Payer: BLUE CROSS/BLUE SHIELD

## 2018-06-19 ENCOUNTER — Encounter (HOSPITAL_COMMUNITY)
Admission: RE | Disposition: A | Discharge status: Home / Self Care | Payer: Self-pay | Source: Ambulatory Visit | Attending: General Surgery

## 2018-06-19 ENCOUNTER — Encounter (HOSPITAL_COMMUNITY): Payer: Self-pay | Admitting: General Surgery

## 2018-06-19 ENCOUNTER — Ambulatory Visit (HOSPITAL_COMMUNITY)
Admission: RE | Admit: 2018-06-19 | Discharge: 2018-06-19 | Disposition: A | Discharge status: Home / Self Care | Payer: BLUE CROSS/BLUE SHIELD | Source: Ambulatory Visit | Attending: General Surgery | Admitting: General Surgery

## 2018-06-19 DIAGNOSIS — K8010 Calculus of gallbladder with chronic cholecystitis without obstruction: Secondary | ICD-10-CM | POA: Insufficient documentation

## 2018-06-19 DIAGNOSIS — K8020 Calculus of gallbladder without cholecystitis without obstruction: Secondary | ICD-10-CM | POA: Diagnosis present

## 2018-06-19 DIAGNOSIS — Z419 Encounter for procedure for purposes other than remedying health state, unspecified: Secondary | ICD-10-CM

## 2018-06-19 HISTORY — PX: CHOLECYSTECTOMY: SHX55

## 2018-06-19 HISTORY — DX: Calculus of gallbladder without cholecystitis without obstruction: K80.20

## 2018-06-19 LAB — CERVICOVAGINAL ANCILLARY ONLY
Bacterial vaginitis: NEGATIVE
Candida vaginitis: NEGATIVE
Chlamydia: NEGATIVE
Neisseria Gonorrhea: NEGATIVE
Trichomonas: NEGATIVE

## 2018-06-19 LAB — POCT PREGNANCY, URINE: PREG TEST UR: NEGATIVE

## 2018-06-19 SURGERY — LAPAROSCOPIC CHOLECYSTECTOMY WITH INTRAOPERATIVE CHOLANGIOGRAM
Anesthesia: General

## 2018-06-19 MED ORDER — IOPAMIDOL (ISOVUE-300) INJECTION 61%
INTRAVENOUS | Status: AC
  Filled 2018-06-19: qty 50

## 2018-06-19 MED ORDER — LIDOCAINE 2% (20 MG/ML) 5 ML SYRINGE
INTRAMUSCULAR | Status: AC
  Filled 2018-06-19: qty 5

## 2018-06-19 MED ORDER — FENTANYL CITRATE (PF) 100 MCG/2ML IJ SOLN: 25.0000 ug | INTRAMUSCULAR | Status: DC | PRN

## 2018-06-19 MED ORDER — CELECOXIB 200 MG PO CAPS
200.0000 mg | ORAL_CAPSULE | ORAL | Status: AC
  Administered 2018-06-19: 200 mg via ORAL
  Filled 2018-06-19: qty 1

## 2018-06-19 MED ORDER — BUPIVACAINE-EPINEPHRINE 0.5% -1:200000 IJ SOLN
INTRAMUSCULAR | Status: DC | PRN
  Administered 2018-06-19: 13 mL

## 2018-06-19 MED ORDER — FENTANYL CITRATE (PF) 100 MCG/2ML IJ SOLN
INTRAMUSCULAR | Status: DC | PRN
  Administered 2018-06-19 (×3): 50 ug via INTRAVENOUS
  Administered 2018-06-19: 100 ug via INTRAVENOUS

## 2018-06-19 MED ORDER — HYDROCODONE-ACETAMINOPHEN 7.5-325 MG PO TABS
1.0000 | ORAL_TABLET | Freq: Once | ORAL | Status: AC | PRN
  Administered 2018-06-19: 1 via ORAL

## 2018-06-19 MED ORDER — DEXAMETHASONE SODIUM PHOSPHATE 10 MG/ML IJ SOLN
INTRAMUSCULAR | Status: AC
  Filled 2018-06-19: qty 1

## 2018-06-19 MED ORDER — 0.9 % SODIUM CHLORIDE (POUR BTL) OPTIME
TOPICAL | Status: DC | PRN
  Administered 2018-06-19: 1000 mL

## 2018-06-19 MED ORDER — ROCURONIUM BROMIDE 10 MG/ML (PF) SYRINGE
PREFILLED_SYRINGE | INTRAVENOUS | Status: DC | PRN
  Administered 2018-06-19: 50 mg via INTRAVENOUS

## 2018-06-19 MED ORDER — CHLORHEXIDINE GLUCONATE CLOTH 2 % EX PADS: 6.0000 | MEDICATED_PAD | Freq: Once | CUTANEOUS | Status: DC

## 2018-06-19 MED ORDER — ONDANSETRON HCL 4 MG/2ML IJ SOLN
INTRAMUSCULAR | Status: AC
  Filled 2018-06-19: qty 2

## 2018-06-19 MED ORDER — SUCCINYLCHOLINE CHLORIDE 200 MG/10ML IV SOSY
PREFILLED_SYRINGE | INTRAVENOUS | Status: AC
  Filled 2018-06-19: qty 10

## 2018-06-19 MED ORDER — SUGAMMADEX SODIUM 200 MG/2ML IV SOLN
INTRAVENOUS | Status: DC | PRN
  Administered 2018-06-19: 200 mg via INTRAVENOUS

## 2018-06-19 MED ORDER — ACETAMINOPHEN 500 MG PO TABS: 1000.0000 mg | ORAL_TABLET | Freq: Four times a day (QID) | ORAL | Status: DC

## 2018-06-19 MED ORDER — HYDROCODONE-ACETAMINOPHEN 5-325 MG PO TABS: 1.0000 | ORAL_TABLET | Freq: Four times a day (QID) | ORAL | 0 refills | Status: DC | PRN

## 2018-06-19 MED ORDER — MEPERIDINE HCL 50 MG/ML IJ SOLN: 6.2500 mg | INTRAMUSCULAR | Status: DC | PRN

## 2018-06-19 MED ORDER — SODIUM CHLORIDE 0.9% FLUSH: 3.0000 mL | INTRAVENOUS | Status: DC | PRN

## 2018-06-19 MED ORDER — BUPIVACAINE HCL (PF) 0.5 % IJ SOLN
INTRAMUSCULAR | Status: AC
  Filled 2018-06-19: qty 30

## 2018-06-19 MED ORDER — ACETAMINOPHEN 325 MG PO TABS: 650.0000 mg | ORAL_TABLET | ORAL | Status: DC | PRN

## 2018-06-19 MED ORDER — HYDROCODONE-ACETAMINOPHEN 7.5-325 MG PO TABS
ORAL_TABLET | ORAL | Status: AC
  Filled 2018-06-19: qty 1

## 2018-06-19 MED ORDER — HYDROMORPHONE HCL 1 MG/ML IJ SOLN
0.2500 mg | INTRAMUSCULAR | Status: DC | PRN
  Administered 2018-06-19 (×4): 0.5 mg via INTRAVENOUS

## 2018-06-19 MED ORDER — FENTANYL CITRATE (PF) 250 MCG/5ML IJ SOLN
INTRAMUSCULAR | Status: AC
  Filled 2018-06-19: qty 5

## 2018-06-19 MED ORDER — SODIUM CHLORIDE 0.9 % IV SOLN
INTRAVENOUS | Status: DC | PRN
  Administered 2018-06-19: 15 mL

## 2018-06-19 MED ORDER — ACETAMINOPHEN 500 MG PO TABS
1000.0000 mg | ORAL_TABLET | ORAL | Status: AC
  Administered 2018-06-19: 1000 mg via ORAL
  Filled 2018-06-19: qty 2

## 2018-06-19 MED ORDER — SCOPOLAMINE 1 MG/3DAYS TD PT72
MEDICATED_PATCH | TRANSDERMAL | Status: AC
  Filled 2018-06-19: qty 1

## 2018-06-19 MED ORDER — PROPOFOL 10 MG/ML IV BOLUS
INTRAVENOUS | Status: DC | PRN
  Administered 2018-06-19: 200 mg via INTRAVENOUS

## 2018-06-19 MED ORDER — MIDAZOLAM HCL 5 MG/5ML IJ SOLN
INTRAMUSCULAR | Status: DC | PRN
  Administered 2018-06-19: 2 mg via INTRAVENOUS

## 2018-06-19 MED ORDER — LACTATED RINGERS IV SOLN
INTRAVENOUS | Status: DC | PRN
  Administered 2018-06-19: 07:00:00 via INTRAVENOUS

## 2018-06-19 MED ORDER — MIDAZOLAM HCL 2 MG/2ML IJ SOLN
INTRAMUSCULAR | Status: AC
  Filled 2018-06-19: qty 2

## 2018-06-19 MED ORDER — SODIUM CHLORIDE 0.9% FLUSH: 3.0000 mL | Freq: Two times a day (BID) | INTRAVENOUS | Status: DC

## 2018-06-19 MED ORDER — ONDANSETRON HCL 4 MG/2ML IJ SOLN
INTRAMUSCULAR | Status: DC | PRN
  Administered 2018-06-19: 4 mg via INTRAVENOUS

## 2018-06-19 MED ORDER — SCOPOLAMINE 1 MG/3DAYS TD PT72
MEDICATED_PATCH | TRANSDERMAL | Status: DC | PRN
  Administered 2018-06-19: 1 via TRANSDERMAL

## 2018-06-19 MED ORDER — EPINEPHRINE PF 1 MG/ML IJ SOLN
INTRAMUSCULAR | Status: AC
  Filled 2018-06-19: qty 1

## 2018-06-19 MED ORDER — HYDROMORPHONE HCL 1 MG/ML IJ SOLN
INTRAMUSCULAR | Status: AC
  Filled 2018-06-19: qty 1

## 2018-06-19 MED ORDER — HYDROMORPHONE HCL 1 MG/ML IJ SOLN: 0.5000 mg | INTRAMUSCULAR | Status: AC

## 2018-06-19 MED ORDER — GABAPENTIN 300 MG PO CAPS
300.0000 mg | ORAL_CAPSULE | ORAL | Status: AC
  Administered 2018-06-19: 300 mg via ORAL
  Filled 2018-06-19: qty 1

## 2018-06-19 MED ORDER — SODIUM CHLORIDE 0.9 % IV SOLN: 250.0000 mL | INTRAVENOUS | Status: DC | PRN

## 2018-06-19 MED ORDER — HYDROMORPHONE HCL 1 MG/ML IJ SOLN: 0.5000 mg | INTRAMUSCULAR | Status: DC

## 2018-06-19 MED ORDER — SODIUM CHLORIDE 0.9 % IR SOLN
Status: DC | PRN
  Administered 2018-06-19: 1

## 2018-06-19 MED ORDER — CEFAZOLIN SODIUM-DEXTROSE 2-4 GM/100ML-% IV SOLN
2.0000 g | INTRAVENOUS | Status: AC
  Administered 2018-06-19: 2 g via INTRAVENOUS
  Filled 2018-06-19: qty 100

## 2018-06-19 MED ORDER — BUPIVACAINE-EPINEPHRINE (PF) 0.25% -1:200000 IJ SOLN
INTRAMUSCULAR | Status: AC
  Filled 2018-06-19: qty 30

## 2018-06-19 MED ORDER — LACTATED RINGERS IV SOLN: INTRAVENOUS | Status: DC

## 2018-06-19 MED ORDER — METOCLOPRAMIDE HCL 5 MG/ML IJ SOLN: 10.0000 mg | Freq: Once | INTRAMUSCULAR | Status: DC | PRN

## 2018-06-19 MED ORDER — LIDOCAINE 2% (20 MG/ML) 5 ML SYRINGE
INTRAMUSCULAR | Status: DC | PRN
  Administered 2018-06-19: 60 mg via INTRAVENOUS

## 2018-06-19 MED ORDER — OXYCODONE HCL 5 MG PO TABS: 5.0000 mg | ORAL_TABLET | ORAL | Status: DC | PRN

## 2018-06-19 MED ORDER — ACETAMINOPHEN 650 MG RE SUPP: 650.0000 mg | RECTAL | Status: DC | PRN

## 2018-06-19 MED ORDER — DEXAMETHASONE SODIUM PHOSPHATE 10 MG/ML IJ SOLN
INTRAMUSCULAR | Status: DC | PRN
  Administered 2018-06-19: 8 mg via INTRAVENOUS

## 2018-06-19 MED ORDER — HYDROMORPHONE HCL 1 MG/ML IJ SOLN
0.5000 mg | INTRAMUSCULAR | Status: AC
  Administered 2018-06-19: 0.5 mg via INTRAVENOUS

## 2018-06-19 MED ORDER — PROPOFOL 10 MG/ML IV BOLUS
INTRAVENOUS | Status: AC
  Filled 2018-06-19: qty 20

## 2018-06-19 MED ORDER — HEMOSTATIC AGENTS (NO CHARGE) OPTIME
TOPICAL | Status: DC | PRN
  Administered 2018-06-19: 1 via TOPICAL

## 2018-06-19 MED ORDER — ROCURONIUM BROMIDE 50 MG/5ML IV SOSY
PREFILLED_SYRINGE | INTRAVENOUS | Status: AC
  Filled 2018-06-19: qty 5

## 2018-06-19 SURGICAL SUPPLY — 49 items
ADH SKN CLS APL DERMABOND .7 (GAUZE/BANDAGES/DRESSINGS) ×1
APL SRG 38 LTWT LNG FL B (MISCELLANEOUS) ×1
APPLICATOR ARISTA FLEXITIP XL (MISCELLANEOUS) ×3 IMPLANT
APPLIER CLIP ROT 10 11.4 M/L (STAPLE) ×3
APR CLP MED LRG 11.4X10 (STAPLE) ×1
BAG SPEC RTRVL 10 TROC 200 (ENDOMECHANICALS)
BAG SPEC RTRVL LRG 6X4 10 (ENDOMECHANICALS)
BLADE CLIPPER SURG (BLADE) IMPLANT
CANISTER SUCT 3000ML PPV (MISCELLANEOUS) ×3 IMPLANT
CHLORAPREP W/TINT 26ML (MISCELLANEOUS) ×3 IMPLANT
CLIP APPLIE ROT 10 11.4 M/L (STAPLE) ×1 IMPLANT
COVER MAYO STAND STRL (DRAPES) ×3 IMPLANT
COVER SURGICAL LIGHT HANDLE (MISCELLANEOUS) ×3 IMPLANT
COVER WAND RF STERILE (DRAPES) IMPLANT
DERMABOND ADVANCED (GAUZE/BANDAGES/DRESSINGS) ×2
DERMABOND ADVANCED .7 DNX12 (GAUZE/BANDAGES/DRESSINGS) ×1 IMPLANT
DRAPE C-ARM 42X72 X-RAY (DRAPES) ×3 IMPLANT
ELECT REM PT RETURN 9FT ADLT (ELECTROSURGICAL) ×3
ELECTRODE REM PT RTRN 9FT ADLT (ELECTROSURGICAL) ×1 IMPLANT
GLOVE BIOGEL PI IND STRL 7.0 (GLOVE) ×1 IMPLANT
GLOVE BIOGEL PI INDICATOR 7.0 (GLOVE) ×2
GLOVE EUDERMIC 7 POWDERFREE (GLOVE) ×3 IMPLANT
GLOVE SURG SS PI 6.5 STRL IVOR (GLOVE) ×3 IMPLANT
GOWN STRL REUS W/ TWL LRG LVL3 (GOWN DISPOSABLE) ×2 IMPLANT
GOWN STRL REUS W/ TWL XL LVL3 (GOWN DISPOSABLE) ×1 IMPLANT
GOWN STRL REUS W/TWL LRG LVL3 (GOWN DISPOSABLE) ×4
GOWN STRL REUS W/TWL XL LVL3 (GOWN DISPOSABLE) ×3
HEMOSTAT ARISTA ABSORB 3G PWDR (MISCELLANEOUS) ×3 IMPLANT
KIT BASIN OR (CUSTOM PROCEDURE TRAY) ×3 IMPLANT
KIT TURNOVER KIT B (KITS) ×3 IMPLANT
NS IRRIG 1000ML POUR BTL (IV SOLUTION) ×3 IMPLANT
PAD ARMBOARD 7.5X6 YLW CONV (MISCELLANEOUS) ×6 IMPLANT
POUCH RETRIEVAL ECOSAC 10 (ENDOMECHANICALS) IMPLANT
POUCH RETRIEVAL ECOSAC 10MM (ENDOMECHANICALS)
POUCH SPECIMEN RETRIEVAL 10MM (ENDOMECHANICALS) IMPLANT
SCISSORS LAP 5X35 DISP (ENDOMECHANICALS) ×3 IMPLANT
SET CHOLANGIOGRAPH 5 50 .035 (SET/KITS/TRAYS/PACK) ×3 IMPLANT
SET IRRIG TUBING LAPAROSCOPIC (IRRIGATION / IRRIGATOR) ×3 IMPLANT
SLEEVE ENDOPATH XCEL 5M (ENDOMECHANICALS) ×3 IMPLANT
SPECIMEN JAR SMALL (MISCELLANEOUS) ×3 IMPLANT
SUT MNCRL AB 4-0 PS2 18 (SUTURE) ×3 IMPLANT
TOWEL OR 17X24 6PK STRL BLUE (TOWEL DISPOSABLE) IMPLANT
TOWEL OR 17X26 10 PK STRL BLUE (TOWEL DISPOSABLE) ×3 IMPLANT
TRAY LAPAROSCOPIC MC (CUSTOM PROCEDURE TRAY) ×3 IMPLANT
TROCAR XCEL BLUNT TIP 100MML (ENDOMECHANICALS) ×3 IMPLANT
TROCAR XCEL NON-BLD 11X100MML (ENDOMECHANICALS) ×3 IMPLANT
TROCAR XCEL NON-BLD 5MMX100MML (ENDOMECHANICALS) ×3 IMPLANT
TUBING INSUFFLATION (TUBING) ×3 IMPLANT
WATER STERILE IRR 1000ML POUR (IV SOLUTION) ×3 IMPLANT

## 2018-06-19 NOTE — Op Note (Signed)
Patient Name:           Julia Mayer   Date of Surgery:        06/19/2018  Pre op Diagnosis:      Chronic cholecystitis with cholelithiasis  Post op Diagnosis:    Same  Procedure:                 Laparoscopic cholecystectomy with cholangiogram  Surgeon:                     Edsel Petrin. Dalbert Batman, M.D., FACS  Assistant:                      OR staff  Operative Indications:     The patient is a 27 year old female who presents for surgical management of symptomatic gall stones. She continues to have gallbladder attacks. She had been worked up with ultrasound, CT, and then MRI. MRI shows 2 small hepatic hemangiomas which do not require follow-up and it also shows a small fluid collection along the border of the pancreas abutting the stomach and fourth portion of the duodenum. Radiologist gave a long differential diagnosis including pancreatic pseudocyst, cystadenoma, foregut duplication, IPMN. The lesion was said to have low risk features by imaging. I have referred her to Dr. Oretha Caprice for EGD and EUS and possible needle biopsy which is scheduled for December 5. She plans to go ahead with that and wanted to clear up this problem before having her gallbladder out. She says that she continues to have pain and wants to go ahead and schedule her gallbladder surgery shortly after the EUS. That is reasonable      The EGD and EUS showed no significant problems other than some fluid around the pancreas.  This was aspirated and the cytology was completely benign.      She continues to have gallbladder attacks and is scheduled for cholecystectomy.     We scheduled her for laparoscopic cholecystectomy with cholangiogram approximate 7 days after the EUS. I have discussed the indications, details, techniques, and numerous risk of gallbladder surgery with her on 2 separate occasions.  She agrees with this plan.  Operative Findings:       The gallbladder was chronically inflamed.  Slightly discolored.   Edematous in the body and fundus.  The cystic duct was tiny but patent.  Large window was created behind the cystic duct and the cystic artery.  The cholangiogram was normal showing normal intrahepatic and extrahepatic biliary anatomy, no ductal dilatation, no filling defect, no obstruction with good flow of contrast into the duodenum.  Four-quadrant inspection revealed no bleeding, injury or visceral disease.  Procedure in Detail:          Following the induction of general endotracheal anesthesia the patient's abdomen was prepped and draped in a sterile fashion.  Surgical timeout was performed.  Intravenous antibiotics were given.  0.25% Marcaine with epinephrine was used as a local infiltration anesthetic.  A vertical incision was made at the lower rim of the umbilicus.  The fascia was incised in the midline and the abdominal cavity entered under direct vision.  An 11 mm Hassan trocar was inserted and secured with the pursestring suture of 0 Vicryl.  Pneumoperitoneum was treated and the video camera inserted.  A trocar was placed in the subxiphoid region and 2 trochars placed in the right upper quadrant.  The fundus of the gallbladder was elevated.  The infundibulum was retracted.  We incised the peritoneum off of the neck of the gallbladder with electrocautery.  We had excellent visualization of the common duct.  We isolated the cystic duct and the cystic artery and created windows behind these structures.  A cholangiogram catheter was inserted into the cystic duct and a cholangiogram obtained using the C arm.  This x-ray was normal as described above.  We removed the catheter, secured the cystic duct and the cystic artery with multiple metal clips and divided them.  The gallbladder was dissected from its bed retrograde, placed in a specimen bag and removed.  The operative field was a little bit raw and cauterized.  There was no bleeding but I chose to spray some Arista hemostatic powder under the bed of the  gallbladder.  All the irrigation fluid was clear.  There was no evidence of bile leak.     The operative field was clear.  The trochars were removed and the pneumoperitoneum released.  The fascia at the umbilicus was closed with 0 Vicryl sutures.  The skin incisions were closed with subcuticular sutures of 4-0 Monocryl and Dermabond.  Patient tolerated the procedure well and was taken to PACU in stable condition.  EBL less than 20 cc.  Counts correct.  Complications none    Addendum: I logged onto the [employer] and reviewed her prescription medication history     Goebel Hellums M. Dalbert Batman, M.D., FACS General and Minimally Invasive Surgery Breast and Colorectal Surgery  06/19/2018 8:40 AM

## 2018-06-19 NOTE — Discharge Instructions (Signed)
[facility] ______[facility] °LAPAROSCOPIC SURGERY: POST OP INSTRUCTIONS °Always review your discharge instruction sheet given to you by the facility where your surgery was performed. °IF YOU HAVE DISABILITY OR FAMILY LEAVE FORMS, YOU MUST BRING THEM TO THE OFFICE FOR PROCESSING.   °DO NOT GIVE THEM TO YOUR DOCTOR. ° °1. A prescription for pain medication may be given to you upon discharge.  Take your pain medication as prescribed, if needed.  If narcotic pain medicine is not needed, then you may take acetaminophen (Tylenol) or ibuprofen (Advil) as needed. °2. Take your usually prescribed medications unless otherwise directed. °3. If you need a refill on your pain medication, please contact your pharmacy.  They will contact our office to request authorization. Prescriptions will not be filled after 5pm or on week-ends. °4. You should follow a light diet the first few days after arrival home, such as soup and crackers, etc.  Be sure to include lots of fluids daily. °5. Most patients will experience some swelling and bruising in the area of the incisions.  Ice packs will help.  Swelling and bruising can take several days to resolve.  °6. It is common to experience some constipation if taking pain medication after surgery.  Increasing fluid intake and taking a stool softener (such as Colace) will usually help or prevent this problem from occurring.  A mild laxative (Milk of Magnesia or Miralax) should be taken according to package instructions if there are no bowel movements after 48 hours. °7. Unless discharge instructions indicate otherwise, you may remove your bandages 24-48 hours after surgery, and you may shower at that time.  You may have steri-strips (small skin tapes) in place directly over the incision.  These strips should be left on the skin for 7-10 days.  If your surgeon used skin glue on the incision, you may shower in 24 hours.  The glue will flake off over the next 2-3 weeks.  Any sutures or  staples will be removed at the office during your follow-up visit. °8. ACTIVITIES:  You may resume regular (light) daily activities beginning the next day--such as daily self-care, walking, climbing stairs--gradually increasing activities as tolerated.  You may have sexual intercourse when it is comfortable.  Refrain from any heavy lifting or straining until approved by your doctor. °a. You may drive when you are no longer taking prescription pain medication, you can comfortably wear a seatbelt, and you can safely maneuver your car and apply brakes. °b. RETURN TO WORK:  __________________________________________________________ °9. You should see your doctor in the office for a follow-up appointment approximately 2-3 weeks after your surgery.  Make sure that you call for this appointment within a day or two after you arrive home to insure a convenient appointment time. °10. OTHER INSTRUCTIONS: __________________________________________________________________________________________________________________________ __________________________________________________________________________________________________________________________ °WHEN TO CALL YOUR DOCTOR: °1. Fever over 101.0 °2. Inability to urinate °3. Continued bleeding from incision. °4. Increased pain, redness, or drainage from the incision. °5. Increasing abdominal pain ° °The clinic staff is available to answer your questions during regular business hours.  Please don’t hesitate to call and ask to speak to one of the nurses for clinical concerns.  If you have a medical emergency, go to the nearest emergency room or call 911.  A surgeon from [facility] is always on call at the hospital. °[address], [address], [place], [place]  [address] ? [address] [address], [place], [place]   [address] °[phone] ? [phone] ? FAX [phone] °Web site:   [email or web address] °

## 2018-06-19 NOTE — Interval H&P Note (Signed)
History and Physical Interval Note:  06/19/2018 5:47 AM  Julia Mayer  has presented today for surgery, with the diagnosis of gallstones  The various methods of treatment have been discussed with the patient and family. After consideration of risks, benefits and other options for treatment, the patient has consented to  Procedure(s): LAPAROSCOPIC CHOLECYSTECTOMY WITH INTRAOPERATIVE CHOLANGIOGRAM ERAS PATHWAY (N/A) as a surgical intervention .  The patient's history has been reviewed, patient examined, no change in status, stable for surgery.  I have reviewed the patient's chart and labs.  Questions were answered to the patient's satisfaction.     Adin Hector

## 2018-06-19 NOTE — Transfer of Care (Signed)
Immediate Anesthesia Transfer of Care Note  Patient: Julia Mayer  Procedure(s) Performed: LAPAROSCOPIC CHOLECYSTECTOMY WITH INTRAOPERATIVE CHOLANGIOGRAM ERAS PATHWAY (N/A )  Patient Location: PACU  Anesthesia Type:General  Level of Consciousness: awake, alert  and oriented  Airway & Oxygen Therapy: Patient Spontanous Breathing and Patient connected to nasal cannula oxygen  Post-op Assessment: Report given to RN, Post -op Vital signs reviewed and stable and Patient moving all extremities X 4  Post vital signs: Reviewed and stable  Last Vitals:  Vitals Value Taken Time  BP    Temp    Pulse    Resp    SpO2      Last Pain:  Vitals:   06/19/18 0647  PainSc: 0-No pain         Complications: No apparent anesthesia complications

## 2018-06-19 NOTE — Anesthesia Procedure Notes (Signed)
Procedure Name: Intubation Date/Time: 06/19/2018 7:35 AM Performed by: Donzetta Matters, RN Pre-anesthesia Checklist: Patient identified, Emergency Drugs available, Suction available and Patient being monitored Patient Re-evaluated:Patient Re-evaluated prior to induction Oxygen Delivery Method: Circle system utilized Preoxygenation: Pre-oxygenation with 100% oxygen Induction Type: IV induction and Cricoid Pressure applied Ventilation: Mask ventilation without difficulty Laryngoscope Size: Mac and 3 Grade View: Grade I Tube type: Oral Tube size: 7.0 mm Number of attempts: 1 Airway Equipment and Method: Stylet Placement Confirmation: ETT inserted through vocal cords under direct vision,  positive ETCO2 and breath sounds checked- equal and bilateral Secured at: 21 cm Tube secured with: Tape Dental Injury: Teeth and Oropharynx as per pre-operative assessment

## 2018-06-19 NOTE — Anesthesia Postprocedure Evaluation (Signed)
Anesthesia Post Note  Patient: Julia Mayer  Procedure(s) Performed: LAPAROSCOPIC CHOLECYSTECTOMY WITH INTRAOPERATIVE CHOLANGIOGRAM ERAS PATHWAY (N/A )     Patient location during evaluation: PACU Anesthesia Type: General Level of consciousness: awake and alert and oriented Pain management: pain level controlled Vital Signs Assessment: post-procedure vital signs reviewed and stable Respiratory status: spontaneous breathing, nonlabored ventilation and respiratory function stable Cardiovascular status: blood pressure returned to baseline and stable Postop Assessment: no apparent nausea or vomiting Anesthetic complications: no    Last Vitals:  Vitals:   06/19/18 0920 06/19/18 0935  BP: (!) 121/92 124/83  Pulse: 82 70  Resp: 14 18  Temp:    SpO2: 99% 94%    Last Pain:  Vitals:   06/19/18 0944  PainSc: 8                  Bryan Omura A.

## 2018-06-20 ENCOUNTER — Encounter (HOSPITAL_COMMUNITY): Payer: Self-pay | Admitting: General Surgery

## 2018-06-24 ENCOUNTER — Other Ambulatory Visit: Payer: Self-pay | Admitting: Obstetrics

## 2018-07-02 ENCOUNTER — Encounter: Payer: Self-pay | Admitting: Obstetrics and Gynecology

## 2018-07-02 ENCOUNTER — Ambulatory Visit: Payer: BLUE CROSS/BLUE SHIELD | Admitting: Obstetrics and Gynecology

## 2018-07-02 VITALS — BP 131/88 | HR 70 | Wt 188.0 lb

## 2018-07-02 DIAGNOSIS — N871 Moderate cervical dysplasia: Secondary | ICD-10-CM | POA: Diagnosis not present

## 2018-07-02 DIAGNOSIS — D069 Carcinoma in situ of cervix, unspecified: Secondary | ICD-10-CM

## 2018-07-02 NOTE — Progress Notes (Signed)
GYNECOLOGY OFFICE FOLLOW UP NOTE  History:  27 y.o. 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 Negative ECC. Referral placed for discussion regarding recommendations. She is otherwise doing well with no complaints.  Past Medical History:  Diagnosis Date  . Chlamydia   . Gallstones 06/19/2018  . No pertinent past medical history   . Normal pregnancy 07/19/2011  . SVD (spontaneous vaginal delivery) 07/20/2011  . Urinary tract infection during pregnancy 06/15/2011  . UTI (urinary tract infection)     Past Surgical History:  Procedure Laterality Date  . CHOLECYSTECTOMY N/A 06/19/2018   Procedure: LAPAROSCOPIC CHOLECYSTECTOMY WITH INTRAOPERATIVE CHOLANGIOGRAM ERAS PATHWAY;  Surgeon: Fanny Skates, MD;  Location: [facility];  Service: General;  Laterality: N/A;  . ESOPHAGOGASTRODUODENOSCOPY N/A 06/11/2018   Procedure: ESOPHAGOGASTRODUODENOSCOPY (EGD);  Surgeon: Milus Banister, MD;  Location: [facility];  Service: Endoscopy;  Laterality: N/A;  . EUS N/A 06/11/2018   Procedure: UPPER ENDOSCOPIC ULTRASOUND (EUS) RADIAL;  Surgeon: Milus Banister, MD;  Location: [facility];  Service: Endoscopy;  Laterality: N/A;  . 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   Marland Kitchen FINE NEEDLE ASPIRATION N/A 06/11/2018   Procedure: FINE NEEDLE ASPIRATION (FNA) LINEAR;  Surgeon: Milus Banister, MD;  Location: [facility];  Service: Endoscopy;  Laterality: N/A;  . INDUCED ABORTION       Current Outpatient Medications:  .  ciprofloxacin (CIPRO) 500 MG tablet, Take 1 tablet (500 mg total) by mouth 2 (two) times daily. (Patient not taking: Reported on 06/16/2018), Disp: 6 tablet, Rfl: 0 .  HYDROcodone-acetaminophen (NORCO) 5-325 MG tablet, Take 1-2 tablets by mouth every 6 (six) hours as needed., Disp: 20 tablet, Rfl: 0  The following portions of the patient's history were reviewed and updated as appropriate: allergies, current medications, past family history, past medical  history, past social history, past surgical history and problem list.   Review of Systems:  Pertinent items noted in HPI and remainder of comprehensive ROS otherwise negative.   Objective:  Physical Exam BP 131/88   Pulse 70   Wt 188 lb (85.3 kg)   LMP 06/17/2018 (Approximate)   BMI 34.39 kg/m  CONSTITUTIONAL: Well-developed, well-nourished female in no acute distress.  HENT:  Normocephalic, atraumatic. External right and left ear normal. Oropharynx is clear and moist EYES: Conjunctivae and EOM are normal. Pupils are equal, round, and reactive to light. No scleral icterus.  NECK: Normal range of motion, supple, no masses SKIN: Skin is warm and dry. No rash noted. Not diaphoretic. No erythema. No pallor. NEUROLOGIC: Alert and oriented to person, place, and time. Normal reflexes, muscle tone coordination. No cranial nerve deficit noted. PSYCHIATRIC: Normal mood and affect. Normal behavior. Normal judgment and thought content. CARDIOVASCULAR: Normal heart rate noted RESPIRATORY: Effort normal, no problems with respiration noted ABDOMEN: Soft, no distention noted.   PELVIC: deferred MUSCULOSKELETAL: Normal range of motion. No edema noted.  Labs and Imaging Colpo 06/16/18 Diagnosis 1. Endocervix, curettage - UNREMARKABLE ENDOCERVICAL GLANDULAR EPITHELIUM. 2. 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 3. 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 4. 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 Gillie Manners MD Pathologist, Electronic Signature (Case signed 06/18/2018)  Assessment & Plan:  1. Moderate dysplasia of cervix (CIN  II) Reviewed recommendation for procedure for CIN 2,3. Reviewed options for ablation versus LEEP, reviewed both in detail. Recommended LEEP given multiple biopsy sites with CIN 2,3. Reviewed risks/benefits of LEEP procedure with patient and her mother, including risks of positive margins, risks of pre-term delivery. They verbalize understanding of all of the above. Answered all questions and she is agreeable to LEEP, will not do today as she recently underwent laparoscopic cholecystectomy but will have return in next few weeks for LEEP.   2. CIN III with severe dysplasia See above   Routine preventative health maintenance measures emphasized. Please refer to After Visit Summary for other counseling recommendations.   Return for LEEP.  Total face-to-face time with patient: 20 minutes. Over 50% of encounter was spent on counseling and coordination of care.  Feliz Beam, M.D. Attending Center for [employer])

## 2018-07-03 ENCOUNTER — Encounter: Payer: BLUE CROSS/BLUE SHIELD | Admitting: Obstetrics & Gynecology

## 2018-07-06 ENCOUNTER — Other Ambulatory Visit: Payer: Self-pay | Admitting: Obstetrics

## 2018-07-15 ENCOUNTER — Other Ambulatory Visit (HOSPITAL_COMMUNITY)
Admission: RE | Admit: 2018-07-15 | Discharge: 2018-07-15 | Disposition: A | Discharge status: Home / Self Care | Payer: BLUE CROSS/BLUE SHIELD | Source: Ambulatory Visit | Attending: Obstetrics and Gynecology | Admitting: Obstetrics and Gynecology

## 2018-07-15 ENCOUNTER — Encounter: Payer: BLUE CROSS/BLUE SHIELD | Admitting: Obstetrics

## 2018-07-15 ENCOUNTER — Encounter: Payer: Self-pay | Admitting: Obstetrics and Gynecology

## 2018-07-15 ENCOUNTER — Ambulatory Visit: Payer: BLUE CROSS/BLUE SHIELD | Admitting: Obstetrics and Gynecology

## 2018-07-15 VITALS — BP 131/88 | HR 73 | Wt 183.3 lb

## 2018-07-15 DIAGNOSIS — R87611 Atypical squamous cells cannot exclude high grade squamous intraepithelial lesion on cytologic smear of cervix (ASC-H): Secondary | ICD-10-CM

## 2018-07-15 DIAGNOSIS — D069 Carcinoma in situ of cervix, unspecified: Secondary | ICD-10-CM | POA: Diagnosis present

## 2018-07-15 LAB — POCT URINE PREGNANCY: Preg Test, Ur: NEGATIVE

## 2018-07-15 NOTE — Progress Notes (Signed)
28 yo [id number] here for LEEP procedure  Patient identified, informed consent obtained, signed copy in chart, time out performed.  Pap smear and colposcopy reviewed.   Pap 05/2018 [facility] Colpo Biopsy CIN II-III ECC neg Teflon coated speculum with smoke evacuator placed.  Cervix visualized. Paracervical block placed.  Large size LOOP used to remove cone of cervix using blend of cut and cautery on LEEP machine.  Edges/Base cauterized with Ball.  Monsel's solution used for hemostasis.  Patient tolerated procedure well.  Patient given post procedure instructions.  Follow up for repeat pap pending results or as needed. Patient states that she has not received Gardasil and was encouraged to receive it

## 2018-08-03 ENCOUNTER — Encounter: Payer: BLUE CROSS/BLUE SHIELD | Admitting: Obstetrics and Gynecology

## 2018-10-31 ENCOUNTER — Encounter (HOSPITAL_COMMUNITY): Payer: Self-pay

## 2018-10-31 ENCOUNTER — Other Ambulatory Visit: Payer: Self-pay

## 2018-10-31 ENCOUNTER — Emergency Department (HOSPITAL_COMMUNITY)
Admission: EM | Admit: 2018-10-31 | Discharge: 2018-10-31 | Disposition: A | Discharge status: Home / Self Care | Payer: BLUE CROSS/BLUE SHIELD | Attending: Emergency Medicine | Admitting: Emergency Medicine

## 2018-10-31 DIAGNOSIS — B9689 Other specified bacterial agents as the cause of diseases classified elsewhere: Secondary | ICD-10-CM | POA: Diagnosis not present

## 2018-10-31 DIAGNOSIS — R109 Unspecified abdominal pain: Secondary | ICD-10-CM

## 2018-10-31 DIAGNOSIS — N760 Acute vaginitis: Secondary | ICD-10-CM | POA: Diagnosis not present

## 2018-10-31 DIAGNOSIS — R1030 Lower abdominal pain, unspecified: Secondary | ICD-10-CM | POA: Insufficient documentation

## 2018-10-31 LAB — URINALYSIS, ROUTINE W REFLEX MICROSCOPIC
Bilirubin Urine: NEGATIVE
Glucose, UA: NEGATIVE mg/dL
Hgb urine dipstick: NEGATIVE
Ketones, ur: NEGATIVE mg/dL
Nitrite: NEGATIVE
Protein, ur: NEGATIVE mg/dL
Specific Gravity, Urine: 1.02 (ref 1.005–1.030)
pH: 6 (ref 5.0–8.0)

## 2018-10-31 LAB — CBC WITH DIFFERENTIAL/PLATELET
Abs Immature Granulocytes: 0.02 10*3/uL (ref 0.00–0.07)
Basophils Absolute: 0 10*3/uL (ref 0.0–0.1)
Basophils Relative: 0 %
Eosinophils Absolute: 0.1 10*3/uL (ref 0.0–0.5)
Eosinophils Relative: 2 %
HCT: 38.7 % (ref 36.0–46.0)
Hemoglobin: 12.1 g/dL (ref 12.0–15.0)
Immature Granulocytes: 0 %
Lymphocytes Relative: 29 %
Lymphs Abs: 2.8 10*3/uL (ref 0.7–4.0)
MCH: 28.7 pg (ref 26.0–34.0)
MCHC: 31.3 g/dL (ref 30.0–36.0)
MCV: 91.7 fL (ref 80.0–100.0)
Monocytes Absolute: 0.8 10*3/uL (ref 0.1–1.0)
Monocytes Relative: 8 %
Neutro Abs: 5.8 10*3/uL (ref 1.7–7.7)
Neutrophils Relative %: 61 %
Platelets: 303 10*3/uL (ref 150–400)
RBC: 4.22 MIL/uL (ref 3.87–5.11)
RDW: 13.5 % (ref 11.5–15.5)
WBC: 9.6 10*3/uL (ref 4.0–10.5)
nRBC: 0 % (ref 0.0–0.2)

## 2018-10-31 LAB — COMPREHENSIVE METABOLIC PANEL
ALT: 14 U/L (ref 0–44)
AST: 13 U/L — ABNORMAL LOW (ref 15–41)
Albumin: 3.4 g/dL — ABNORMAL LOW (ref 3.5–5.0)
Alkaline Phosphatase: 80 U/L (ref 38–126)
Anion gap: 9 (ref 5–15)
BUN: 14 mg/dL (ref 6–20)
CO2: 25 mmol/L (ref 22–32)
Calcium: 9 mg/dL (ref 8.9–10.3)
Chloride: 105 mmol/L (ref 98–111)
Creatinine, Ser: 0.81 mg/dL (ref 0.44–1.00)
GFR calc Af Amer: >60 mL/min (ref >60)
GFR calc non Af Amer: >60 mL/min (ref >60)
Glucose, Bld: 96 mg/dL (ref 70–99)
Potassium: 3.9 mmol/L (ref 3.5–5.1)
Sodium: 139 mmol/L (ref 135–145)
Total Bilirubin: 0.3 mg/dL (ref 0.3–1.2)
Total Protein: 7 g/dL (ref 6.5–8.1)

## 2018-10-31 LAB — WET PREP, GENITAL
Sperm: NONE SEEN
Trich, Wet Prep: NONE SEEN
Yeast Wet Prep HPF POC: NONE SEEN

## 2018-10-31 LAB — I-STAT BETA HCG BLOOD, ED (MC, WL, AP ONLY): I-stat hCG, quantitative: <5 m[IU]/mL (ref <5)

## 2018-10-31 LAB — HIV ANTIBODY (ROUTINE TESTING W REFLEX): HIV Screen 4th Generation wRfx: NONREACTIVE

## 2018-10-31 LAB — RPR: RPR Ser Ql: NONREACTIVE

## 2018-10-31 LAB — LIPASE, BLOOD: Lipase: 27 U/L (ref 11–51)

## 2018-10-31 MED ORDER — LIDOCAINE HCL (PF) 1 % IJ SOLN: 0.9000 mL | Freq: Once | INTRAMUSCULAR | Status: DC

## 2018-10-31 MED ORDER — METRONIDAZOLE 500 MG PO TABS: 500.0000 mg | ORAL_TABLET | Freq: Two times a day (BID) | ORAL | 0 refills | Status: AC

## 2018-10-31 MED ORDER — LIDOCAINE HCL (PF) 1 % IJ SOLN
INTRAMUSCULAR | Status: AC
  Filled 2018-10-31: qty 5

## 2018-10-31 MED ORDER — CEFTRIAXONE SODIUM 250 MG IJ SOLR
250.0000 mg | Freq: Once | INTRAMUSCULAR | Status: DC
  Filled 2018-10-31: qty 250

## 2018-10-31 MED ORDER — AZITHROMYCIN 250 MG PO TABS
1000.0000 mg | ORAL_TABLET | Freq: Once | ORAL | Status: DC
  Filled 2018-10-31: qty 4

## 2018-10-31 MED ORDER — IBUPROFEN 400 MG PO TABS
400.0000 mg | ORAL_TABLET | Freq: Once | ORAL | Status: AC
  Administered 2018-10-31: 400 mg via ORAL
  Filled 2018-10-31: qty 1

## 2018-10-31 MED ORDER — SODIUM CHLORIDE 0.9 % IV BOLUS: 1000.0000 mL | Freq: Once | INTRAVENOUS | Status: DC

## 2018-10-31 NOTE — ED Provider Notes (Signed)
[facility] EMERGENCY DEPARTMENT Provider Note   CSN: [id number] Arrival date & time: 10/31/18  3557    History   Chief Complaint Chief Complaint  Patient presents with  . Abdominal Cramping    HPI Julia Mayer is a 28 y.o. female presenting today for concern of lower abdominal cramping that has been persistent for the past 1 week.  She states that her cramping comes and goes however has felt more persistent over the past 3 days.  She describes a moderate intensity cramping sensation on both sides of her lower abdomen without alleviating or aggravating factors.  Patient reports regular cycles and reports 2 days of menstrual bleeding earlier this week.  Additionally patient reports that she has had unprotected sex with 1 partner who is new since February, however she denies concern for STDs at this time.  Patient denies fever/chills, cough/shortness of breath, chest pain, nausea/vomiting, diarrhea, dysuria/hematuria or any additional concerns.     HPI  Past Medical History:  Diagnosis Date  . Chlamydia   . Gallstones 06/19/2018  . No pertinent past medical history   . Normal pregnancy 07/19/2011  . SVD (spontaneous vaginal delivery) 07/20/2011  . Urinary tract infection during pregnancy 06/15/2011  . UTI (urinary tract infection)     Patient Active Problem List   Diagnosis Date Noted  . Gallstones 06/19/2018  . Abnormal CT of the abdomen   . Candida vaginitis 01/28/2011    Past Surgical History:  Procedure Laterality Date  . CHOLECYSTECTOMY N/A 06/19/2018   Procedure: LAPAROSCOPIC CHOLECYSTECTOMY WITH INTRAOPERATIVE CHOLANGIOGRAM ERAS PATHWAY;  Surgeon: Fanny Skates, MD;  Location: [facility];  Service: General;  Laterality: N/A;  . ESOPHAGOGASTRODUODENOSCOPY N/A 06/11/2018   Procedure: ESOPHAGOGASTRODUODENOSCOPY (EGD);  Surgeon: Milus Banister, MD;  Location: [facility];  Service: Endoscopy;  Laterality: N/A;  . EUS N/A 06/11/2018   Procedure: UPPER  ENDOSCOPIC ULTRASOUND (EUS) RADIAL;  Surgeon: Milus Banister, MD;  Location: [facility] ENDOS[facility];  Service: Endoscopy;  Laterality: N/A;  . 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   Marland Kitchen FINE NEEDLE ASPIRATION N/A 06/11/2018   Procedure: FINE NEEDLE ASPIRATION (FNA) LINEAR;  Surgeon: Milus Banister, MD;  Location: [facility] ENDOS[facility];  Service: Endoscopy;  Laterality: N/A;  . INDUCED ABORTION       OB History    Gravida  5   Para  2   Term  2   Preterm  0   AB  3   Living  2     SAB  0   TAB  3   Ectopic  0   Multiple  0   Live Births  2            Home Medications    Prior to Admission medications   Medication Sig Start Date End Date Taking? Authorizing Provider  ciprofloxacin (CIPRO) 500 MG tablet Take 1 tablet (500 mg total) by mouth 2 (two) times daily. Patient not taking: Reported on 06/16/2018 06/11/18   Milus Banister, MD  HYDROcodone-acetaminophen [facility]) 5-325 MG tablet Take 1-2 tablets by mouth every 6 (six) hours as needed. Patient not taking: Reported on 07/15/2018 06/19/18   Kalman Drape, PA  metroNIDAZOLE (FLAGYL) 500 MG tablet Take 1 tablet (500 mg total) by mouth 2 (two) times daily for 7 days. 10/31/18 11/07/18  Deliah Boston, PA-C    Family History Family History  Problem Relation Age of Onset  . Hypertension Mother   .  Asthma Mother   . Alcohol abuse Mother   . Hypertension Father   . Hyperlipidemia Father   . Cancer Maternal Grandmother   . Cancer Paternal Grandmother   . Anesthesia problems Paternal Grandmother     Social History Social History   Tobacco Use  . Smoking status: Never Smoker  . Smokeless tobacco: Never Used  Substance Use Topics  . Alcohol use: No    Comment: occasionally but not during pregnancy  . Drug use: No     Allergies   Patient has no known allergies.   Review of Systems Review of Systems  Constitutional: Negative.  Negative for chills and fever.  Respiratory: Negative.  Negative for cough and  shortness of breath.   Cardiovascular: Negative.  Negative for chest pain.  Gastrointestinal: Positive for abdominal pain (Cramping). Negative for diarrhea, nausea and vomiting.  Genitourinary: Positive for vaginal bleeding. Negative for difficulty urinating, dyspareunia, dysuria, flank pain, hematuria and vaginal discharge.  Musculoskeletal: Negative.  Negative for arthralgias and myalgias.  All other systems reviewed and are negative.  Physical Exam Updated Vital Signs BP (!) 143/90   Pulse 60   Temp 97.7 F (36.5 C) (Oral)   Resp 15   LMP 10/04/2018 (Approximate)   SpO2 100%   Breastfeeding No   Physical Exam Constitutional:      General: She is not in acute distress.    Appearance: Normal appearance. She is well-developed. She is not ill-appearing or diaphoretic.  HENT:     Head: Normocephalic and atraumatic.     Right Ear: External ear normal.     Left Ear: External ear normal.     Nose: Nose normal.  Eyes:     General: Vision grossly intact. Gaze aligned appropriately.     Pupils: Pupils are equal, round, and reactive to light.  Neck:     Musculoskeletal: Normal range of motion.     Trachea: Trachea and phonation normal. No tracheal deviation.  Cardiovascular:     Pulses:          Dorsalis pedis pulses are 2+ on the right side and 2+ on the left side.       Posterior tibial pulses are 2+ on the right side and 2+ on the left side.  Pulmonary:     Effort: Pulmonary effort is normal. No respiratory distress.  Abdominal:     General: A surgical scar is present. Bowel sounds are normal. There is no distension.     Palpations: Abdomen is soft.     Tenderness: There is abdominal tenderness (Patient reports a very mild lower abdominal tenderness). There is no right CVA tenderness, left CVA tenderness, guarding or rebound.     Comments: Well-healed surgical scar from cholecystectomy  Genitourinary:    Comments: Exam chaperoned by Laurence Aly.  Pelvic exam: normal external  genitalia without evidence of trauma. VULVA: normal appearing vulva with no masses, tenderness or lesion. VAGINA: normal appearing vagina with normal color and no lesions. CERVIX: normal appearing cervix without lesions, cervical motion tenderness absent, cervical os closed; Purulent discharge present for cervix. Wet prep and DNA probe for chlamydia and GC obtained.  ADNEXA: normal adnexa in size, nontender and no masses UTERUS: uterus is normal size, shape, consistency and nontender.  Musculoskeletal: Normal range of motion.     Right lower leg: Normal. She exhibits no tenderness. No edema.     Left lower leg: Normal. She exhibits no tenderness. No edema.  Feet:     Right foot:  Protective Sensation: 5 sites tested. 5 sites sensed.     Left foot:     Protective Sensation: 5 sites tested. 5 sites sensed.  Skin:    General: Skin is warm and dry.  Neurological:     Mental Status: She is alert.     GCS: GCS eye subscore is 4. GCS verbal subscore is 5. GCS motor subscore is 6.     Comments: Speech is clear and goal oriented, follows commands Major Cranial nerves without deficit, no facial droop Moves extremities without ataxia, coordination intact  Psychiatric:        Behavior: Behavior normal.    ED Treatments / Results  Labs (all labs ordered are listed, but only abnormal results are displayed) Labs Reviewed  WET PREP, GENITAL - Abnormal; Notable for the following components:      Result Value   Clue Cells Wet Prep HPF POC PRESENT (*)    WBC, Wet Prep HPF POC MANY (*)    All other components within normal limits  COMPREHENSIVE METABOLIC PANEL - Abnormal; Notable for the following components:   Albumin 3.4 (*)    AST 13 (*)    All other components within normal limits  URINALYSIS, ROUTINE W REFLEX MICROSCOPIC - Abnormal; Notable for the following components:   Leukocytes,Ua TRACE (*)    Bacteria, UA RARE (*)    All other components within normal limits  CBC WITH  DIFFERENTIAL/PLATELET  LIPASE, BLOOD  RPR  HIV ANTIBODY (ROUTINE TESTING W REFLEX)  I-STAT BETA HCG BLOOD, ED ([facility], [facility], [facility] ONLY)  GC/CHLAMYDIA PROBE AMP ([place]) NOT AT [facility]    EKG None  Radiology No results found.  Procedures Procedures (including critical care time)  Medications Ordered in ED Medications  cefTRIAXone (ROCEPHIN) injection 250 mg (250 mg Intramuscular Not Given 10/31/18 1010)  azithromycin (ZITHROMAX) tablet 1,000 mg (1,000 mg Oral Not Given 10/31/18 1010)  lidocaine (PF) (XYLOCAINE) 1 % injection 0.9 mL (0.9 mLs Other Not Given 10/31/18 1011)  lidocaine (PF) (XYLOCAINE) 1 % injection (has no administration in time range)  ibuprofen (ADVIL) tablet 400 mg (400 mg Oral Given 10/31/18 1607)     Initial Impression / Assessment and Plan / ED Course  I have reviewed the triage vital signs and the nursing notes.  Pertinent labs & imaging results that were available during my care of the patient were reviewed by me and considered in my medical decision making (see chart for details).    28 year old female 1 week of lower abdominal cramping with a 2-day stent of light vaginal bleeding that stopped 2 days ago.  Sexual contact with 1 new female x2 months without protection use.  No nausea vomiting or diarrhea.  No fever/chills cough or chest pain.  Lower abdomen vaguely tender on palpation however no peritoneal signs guarding or rebound.  Pelvic examination with purulent discharge from cervix, no cervical motion tenderness or overt pain on examination.  Beta-hCG negative CBC within normal limits CMP nonacute Urinalysis with trace leukocytes rare bacteria, no urinary symptoms, doubt UTI at this time Lipase within normal limit Wet Prep with clue cells and white blood cells present HIV/RPR sent/pending GC chlamydia sent/pending ----------------- Plan is to treat patient with azithromycin and Rocephin for exam concerning for STD.  Additionally will begin patient on Flagyl  for bacterial vaginosis.  No concern for PID at this time as patient is without cervical motion tenderness, fever or significant abdominal pain on examination.  Low suspicion for TOA at this time  as patient is without adnexal tenderness, leukocytosis, fever or other signs of infection. ----------------- Patient has refused azithromycin and Rocephin for treatment of suspected gonorrhea and chlamydia.  Shared discussion making was made with patient and she plans to follow-up with her OB/GYN early next week for reevaluation and to follow-up on her GC chlamydia tests on her MyChart account.  Patient states understanding that she may return to the emergency department at anytime for further evaluation and treatment.  Patient is fully alert and oriented with mental capacity to make her own medical decisions and states understanding's of the risks of untreated gonococcal/chlamydial infections including worsening of symptoms/infection, pain, disability and death. Patient has agreed to treatment of Flagyl for her bacterial vaginosis.  I discussed precautions regarding Flagyl and not drink alcohol taking this medication she states understanding. - Patient understands that they have GC/Chlamydia cultures pending and that they will need to inform all sexual partners if results return positive.  Patient to be discharged with instructions to follow up with OBGYN/PCP. Discussed importance of using protection when sexually active.  - Based on based on history exam and work-up today do not suspect appendicitis, diverticulitis, TOA, ectopic pregnancy or other acute abdominopelvic etiology of patient's symptoms today.  On reexamination of the abdomen it is nontender/nondistended and without peritoneal signs.  Additionally patient does not meet SIRS or sepsis criteria.  Pain improved following 1 ibuprofen here in the department.  Patient states understanding of care plan and is agreeable for discharge.  At this time there does  not appear to be any evidence of an acute emergency medical condition and the patient appears stable for discharge with appropriate outpatient follow up. Diagnosis was discussed with patient who verbalizes understanding of care plan and is agreeable to discharge. I have discussed return precautions with patient who verbalizes understanding of return precautions. Patient encouraged to follow-up with their PCP and OBGYN. All questions answered. Patient has been discharged in good condition.   Note: Portions of this report may have been transcribed using voice recognition software. Every effort was made to ensure accuracy; however, inadvertent computerized transcription errors may still be present. Final Clinical Impressions(s) / ED Diagnoses   Final diagnoses:  Abdominal cramping  BV (bacterial vaginosis)    ED Discharge Orders         Ordered    metroNIDAZOLE (FLAGYL) 500 MG tablet  2 times daily     10/31/18 0950           Deliah Boston, PA-C 10/31/18 1034    Maudie Flakes, MD 11/01/18 [phone]

## 2018-10-31 NOTE — ED Notes (Signed)
Patient verbalizes understanding of discharge instructions . Opportunity for questions and answers were provided . Armband removed by staff ,Pt discharged from ED. W/C  offered at D/C  and Declined W/C at D/C and was escorted to lobby by RN.  

## 2018-10-31 NOTE — Discharge Instructions (Addendum)
You have been diagnosed today with Abdominal Cramping, bacterial vaginosis.  At this time there does not appear to be the presence of an emergent medical condition, however there is always the potential for conditions to change. Please read and follow the below instructions.  Please return to the Emergency Department immediately for any new or worsening symptoms or if your symptoms do not improve within 3 days. Please be sure to follow up with your Primary Care Provider within one week regarding your visit today; please call their office to schedule an appointment even if you are feeling better for a follow-up visit. You have refused the azithromycin and Rocephin which are treatments for the STDs gonorrhea and chlamydia today.  Please follow-up on your testing on your MyChart account and go see your OB/GYN regarding your symptoms and diagnosis and for further treatment.  If you change your mind regarding your treatment you may return to the emergency department for treatment at any time.  You have been tested today for gonorrhea and chlamydia as well as HIV and syphilis. These results will be available in approximately 3 days. You may check your MyChart account for results. Please inform all sexual partners of positive results and that they should be tested and treated as well. Please wait 2 weeks and be sure that you and your partners are symptom free before returning to sexual activity. Please use protection with every sexual encounter.  Your test today also showed signs of bacterial vaginosis.  Please take the medication Flagyl twice daily for the next 7 days to treat this disease.  Do not drink alcohol while taking Flagyl as it will make you sick and vomit.  Follow Up: Please followup with your primary doctor and OBGYN in 3 days for discussion of your diagnoses and further evaluation after today's visit; if you do not have a primary care doctor use the resource guide provided to find one; Please return  to the ER for worsening symptoms, high fevers or persistent vomiting.  Get help right away if: You have very bad pain in your belly, and medicine does not help it. You cannot pee (urinate). Your symptoms come back after treatment. Your symptoms get worse after treatment. You have a fever. You feel tired (fatigued). Your belly (abdomen) hurts. You feel like you are going to throw up (are nauseous). You throw up (vomit). You have watery poop (diarrhea). Your back hurts. Any new/concerning or worsening symptoms  Please read the additional information packets attached to your discharge summary.  Do not take your medicine if  develop an itchy rash, swelling in your mouth or lips, or difficulty breathing.

## 2018-10-31 NOTE — ED Triage Notes (Signed)
Patient reports generalize abdominal cramping for the past 2-3 days, denies vaginal bleeding or discharge, pain is not worsened or lessened by anything. Pt denies n/v/d, fever or chills. Has had unprotected sex recently, reports she had her menstrual cycle last month as well as another few days of bleeding at the end of the month.

## 2018-10-31 NOTE — ED Notes (Signed)
Pelvic cart set up at bedside  

## 2018-10-31 NOTE — ED Triage Notes (Signed)
Pt endorses left sided abd pain x 2 weeks, denies n/v/d, LMB yesterday, had 2 menstrual cycles last month. VSS

## 2018-11-02 LAB — GC/CHLAMYDIA PROBE AMP (~~LOC~~) NOT AT ARMC
Chlamydia: NEGATIVE
Neisseria Gonorrhea: NEGATIVE

## 2019-04-22 ENCOUNTER — Emergency Department (HOSPITAL_COMMUNITY)
Admission: EM | Admit: 2019-04-22 | Discharge: 2019-04-22 | Discharge status: Against Medical Advice | Payer: BC Managed Care – PPO | Attending: Emergency Medicine | Admitting: Emergency Medicine

## 2019-04-22 ENCOUNTER — Other Ambulatory Visit: Payer: Self-pay

## 2019-04-22 ENCOUNTER — Encounter (HOSPITAL_COMMUNITY): Payer: Self-pay

## 2019-04-22 DIAGNOSIS — Z5321 Procedure and treatment not carried out due to patient leaving prior to being seen by health care provider: Secondary | ICD-10-CM | POA: Insufficient documentation

## 2019-04-22 DIAGNOSIS — R1032 Left lower quadrant pain: Secondary | ICD-10-CM | POA: Diagnosis present

## 2019-04-22 LAB — CBC
HCT: 39.1 % (ref 36.0–46.0)
Hemoglobin: 11.9 g/dL — ABNORMAL LOW (ref 12.0–15.0)
MCH: 28.5 pg (ref 26.0–34.0)
MCHC: 30.4 g/dL (ref 30.0–36.0)
MCV: 93.5 fL (ref 80.0–100.0)
Platelets: 302 10*3/uL (ref 150–400)
RBC: 4.18 MIL/uL (ref 3.87–5.11)
RDW: 13 % (ref 11.5–15.5)
WBC: 9.4 10*3/uL (ref 4.0–10.5)
nRBC: 0 % (ref 0.0–0.2)

## 2019-04-22 LAB — I-STAT BETA HCG BLOOD, ED (MC, WL, AP ONLY): I-stat hCG, quantitative: <5 m[IU]/mL (ref <5)

## 2019-04-22 LAB — COMPREHENSIVE METABOLIC PANEL
ALT: 28 U/L (ref 0–44)
AST: 13 U/L — ABNORMAL LOW (ref 15–41)
Albumin: 3.5 g/dL (ref 3.5–5.0)
Alkaline Phosphatase: 79 U/L (ref 38–126)
Anion gap: 8 (ref 5–15)
BUN: 10 mg/dL (ref 6–20)
CO2: 25 mmol/L (ref 22–32)
Calcium: 8.7 mg/dL — ABNORMAL LOW (ref 8.9–10.3)
Chloride: 106 mmol/L (ref 98–111)
Creatinine, Ser: 0.7 mg/dL (ref 0.44–1.00)
GFR calc Af Amer: >60 mL/min (ref >60)
GFR calc non Af Amer: >60 mL/min (ref >60)
Glucose, Bld: 94 mg/dL (ref 70–99)
Potassium: 3.7 mmol/L (ref 3.5–5.1)
Sodium: 139 mmol/L (ref 135–145)
Total Bilirubin: 0.3 mg/dL (ref 0.3–1.2)
Total Protein: 6.8 g/dL (ref 6.5–8.1)

## 2019-04-22 LAB — URINALYSIS, ROUTINE W REFLEX MICROSCOPIC
Bilirubin Urine: NEGATIVE
Glucose, UA: NEGATIVE mg/dL
Ketones, ur: 5 mg/dL — AB
Nitrite: NEGATIVE
Protein, ur: 100 mg/dL — AB
RBC / HPF: >50 RBC/hpf — ABNORMAL HIGH (ref 0–5)
Specific Gravity, Urine: 1.027 (ref 1.005–1.030)
pH: 6 (ref 5.0–8.0)

## 2019-04-22 LAB — LIPASE, BLOOD: Lipase: 28 U/L (ref 11–51)

## 2019-04-22 MED ORDER — SODIUM CHLORIDE 0.9% FLUSH: 3.0000 mL | Freq: Once | INTRAVENOUS | Status: DC

## 2019-04-22 NOTE — ED Notes (Signed)
Pt called x 3 no answer 

## 2019-04-22 NOTE — ED Triage Notes (Signed)
Pt states that she has been having lower back pain and L flank pain, reports vaginal bleeding for the past 3 days. Denies n/v/d/fevers.

## 2019-04-26 ENCOUNTER — Emergency Department (HOSPITAL_COMMUNITY)
Admission: EM | Admit: 2019-04-26 | Discharge: 2019-04-26 | Disposition: A | Discharge status: Home / Self Care | Payer: BC Managed Care – PPO | Attending: Emergency Medicine | Admitting: Emergency Medicine

## 2019-04-26 ENCOUNTER — Other Ambulatory Visit: Payer: Self-pay

## 2019-04-26 DIAGNOSIS — R1012 Left upper quadrant pain: Secondary | ICD-10-CM | POA: Diagnosis not present

## 2019-04-26 LAB — I-STAT BETA HCG BLOOD, ED (MC, WL, AP ONLY): I-stat hCG, quantitative: <5 m[IU]/mL (ref <5)

## 2019-04-26 LAB — URINALYSIS, ROUTINE W REFLEX MICROSCOPIC
Bacteria, UA: NONE SEEN
Bilirubin Urine: NEGATIVE
Glucose, UA: NEGATIVE mg/dL
Ketones, ur: NEGATIVE mg/dL
Leukocytes,Ua: NEGATIVE
Nitrite: NEGATIVE
Protein, ur: NEGATIVE mg/dL
Specific Gravity, Urine: 1.024 (ref 1.005–1.030)
pH: 6 (ref 5.0–8.0)

## 2019-04-26 LAB — COMPREHENSIVE METABOLIC PANEL
ALT: 21 U/L (ref 0–44)
AST: 15 U/L (ref 15–41)
Albumin: 3.6 g/dL (ref 3.5–5.0)
Alkaline Phosphatase: 86 U/L (ref 38–126)
Anion gap: 8 (ref 5–15)
BUN: 10 mg/dL (ref 6–20)
CO2: 26 mmol/L (ref 22–32)
Calcium: 8.8 mg/dL — ABNORMAL LOW (ref 8.9–10.3)
Chloride: 101 mmol/L (ref 98–111)
Creatinine, Ser: 0.76 mg/dL (ref 0.44–1.00)
GFR calc Af Amer: >60 mL/min (ref >60)
GFR calc non Af Amer: >60 mL/min (ref >60)
Glucose, Bld: 95 mg/dL (ref 70–99)
Potassium: 3.8 mmol/L (ref 3.5–5.1)
Sodium: 135 mmol/L (ref 135–145)
Total Bilirubin: 0.5 mg/dL (ref 0.3–1.2)
Total Protein: 7.3 g/dL (ref 6.5–8.1)

## 2019-04-26 LAB — CBC
HCT: 39.4 % (ref 36.0–46.0)
Hemoglobin: 12.6 g/dL (ref 12.0–15.0)
MCH: 29.2 pg (ref 26.0–34.0)
MCHC: 32 g/dL (ref 30.0–36.0)
MCV: 91.4 fL (ref 80.0–100.0)
Platelets: 326 10*3/uL (ref 150–400)
RBC: 4.31 MIL/uL (ref 3.87–5.11)
RDW: 12.6 % (ref 11.5–15.5)
WBC: 8.2 10*3/uL (ref 4.0–10.5)
nRBC: 0 % (ref 0.0–0.2)

## 2019-04-26 LAB — LIPASE, BLOOD: Lipase: 23 U/L (ref 11–51)

## 2019-04-26 MED ORDER — FAMOTIDINE 20 MG PO TABS: 20.0000 mg | ORAL_TABLET | Freq: Two times a day (BID) | ORAL | 0 refills | Status: DC

## 2019-04-26 MED ORDER — DICYCLOMINE HCL 20 MG PO TABS: 20.0000 mg | ORAL_TABLET | Freq: Two times a day (BID) | ORAL | 0 refills | Status: DC

## 2019-04-26 NOTE — ED Notes (Signed)
Patient verbalizes understanding of discharge instructions. Opportunity for questioning and answers were provided. Armband removed by staff, pt discharged from ED.  

## 2019-04-26 NOTE — ED Triage Notes (Addendum)
Pt has been having lower back and lower left abd pain since Tuesday. Pt thought it was related to menstrual cycle but period has stopped and the pain persists. Pt also reports frequent urination and slight nausea but no emesis since Tuesday. Pt came 10/15 for same cc but left because wait was too long.

## 2019-04-26 NOTE — ED Provider Notes (Signed)
[facility] EMERGENCY DEPARTMENT Provider Note   CSN: [id number] Arrival date & time: 04/26/19  1051     History   Chief Complaint Chief Complaint  Patient presents with  . Abdominal Pain  . Back Pain    HPI Julia Mayer is a 28 y.o. female.     Patient with history of cholecystectomy presents with c/o of abdominal pain and flank pain ongoing over the past 1 week. Pain is sharp and throbbing and located in the L abdomen and flank. + increasing urinary frequency, - dysuria and hematuria.  She has had some nausea but no vomiting.  No chest pain or shortness of breath.  No changes to her bowel movements which are typically soft in nature.  No treatments prior to arrival.  Patient denies any fevers.  She denies heavy alcohol or NSAID use.  Onset of symptoms acute.  Course is constant.  Nothing makes symptoms better.  Symptoms are not made worse with food.  She does reports decreased appetite though.     Past Medical History:  Diagnosis Date  . Chlamydia   . Gallstones 06/19/2018  . No pertinent past medical history   . Normal pregnancy 07/19/2011  . SVD (spontaneous vaginal delivery) 07/20/2011  . Urinary tract infection during pregnancy 06/15/2011  . UTI (urinary tract infection)     Patient Active Problem List   Diagnosis Date Noted  . Gallstones 06/19/2018  . Abnormal CT of the abdomen   . Candida vaginitis 01/28/2011    Past Surgical History:  Procedure Laterality Date  . CHOLECYSTECTOMY N/A 06/19/2018   Procedure: LAPAROSCOPIC CHOLECYSTECTOMY WITH INTRAOPERATIVE CHOLANGIOGRAM ERAS PATHWAY;  Surgeon: Fanny Skates, MD;  Location: [facility];  Service: General;  Laterality: N/A;  . ESOPHAGOGASTRODUODENOSCOPY N/A 06/11/2018   Procedure: ESOPHAGOGASTRODUODENOSCOPY (EGD);  Surgeon: Milus Banister, MD;  Location: [facility];  Service: Endoscopy;  Laterality: N/A;  . EUS N/A 06/11/2018   Procedure: UPPER ENDOSCOPIC ULTRASOUND (EUS) RADIAL;  Surgeon: Milus Banister, MD;  Location: [facility];  Service: Endoscopy;  Laterality: N/A;  . 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   Marland Kitchen FINE NEEDLE ASPIRATION N/A 06/11/2018   Procedure: FINE NEEDLE ASPIRATION (FNA) LINEAR;  Surgeon: Milus Banister, MD;  Location: [facility];  Service: Endoscopy;  Laterality: N/A;  . INDUCED ABORTION       OB History    Gravida  5   Para  2   Term  2   Preterm  0   AB  3   Living  2     SAB  0   TAB  3   Ectopic  0   Multiple  0   Live Births  2            Home Medications    Prior to Admission medications   Medication Sig Start Date End Date Taking? Authorizing Provider  ciprofloxacin (CIPRO) 500 MG tablet Take 1 tablet (500 mg total) by mouth 2 (two) times daily. Patient not taking: Reported on 06/16/2018 06/11/18   Milus Banister, MD  HYDROcodone-acetaminophen [facility]) 5-325 MG tablet Take 1-2 tablets by mouth every 6 (six) hours as needed. Patient not taking: Reported on 07/15/2018 06/19/18   Kalman Drape, PA    Family History Family History  Problem Relation Age of Onset  . Hypertension Mother   . Asthma Mother   . Alcohol abuse Mother   . Hypertension Father   .  Hyperlipidemia Father   . Cancer Maternal Grandmother   . Cancer Paternal Grandmother   . Anesthesia problems Paternal Grandmother     Social History Social History   Tobacco Use  . Smoking status: Never Smoker  . Smokeless tobacco: Never Used  Substance Use Topics  . Alcohol use: No    Comment: occasionally but not during pregnancy  . Drug use: No     Allergies   Patient has no known allergies.   Review of Systems Review of Systems  Constitutional: Positive for appetite change. Negative for fever.  HENT: Negative for rhinorrhea and sore throat.   Eyes: Negative for redness.  Respiratory: Negative for cough.   Cardiovascular: Negative for chest pain.  Gastrointestinal: Positive for abdominal pain and nausea. Negative for diarrhea and  vomiting.  Genitourinary: Positive for flank pain, frequency and vaginal bleeding. Negative for decreased urine volume, dysuria, hematuria and vaginal discharge.  Musculoskeletal: Negative for myalgias.  Skin: Negative for rash.  Neurological: Negative for headaches.     Physical Exam Updated Vital Signs BP 135/86 (BP Location: Right Arm)   Pulse (!) 57   Temp 98.2 F (36.8 C) (Oral)   Resp 14   SpO2 100%   Physical Exam Vitals signs and nursing note reviewed.  Constitutional:      Appearance: She is well-developed.  HENT:     Head: Normocephalic and atraumatic.  Eyes:     General:        Right eye: No discharge.        Left eye: No discharge.     Conjunctiva/sclera: Conjunctivae normal.  Neck:     Musculoskeletal: Normal range of motion and neck supple.  Cardiovascular:     Rate and Rhythm: Normal rate and regular rhythm.     Heart sounds: Normal heart sounds.  Pulmonary:     Effort: Pulmonary effort is normal.     Breath sounds: Normal breath sounds.  Abdominal:     Palpations: Abdomen is soft.     Tenderness: There is abdominal tenderness (Patient reports pain in the epigastric and left upper quadrants however she does not react with palpation in these areas) in the epigastric area and left upper quadrant. There is no guarding or rebound. Negative signs include Murphy's sign and McBurney's sign.     Comments: Postoperative cholecystectomy scars noted.  Skin:    General: Skin is warm and dry.  Neurological:     Mental Status: She is alert.      ED Treatments / Results  Labs (all labs ordered are listed, but only abnormal results are displayed) Labs Reviewed  COMPREHENSIVE METABOLIC PANEL - Abnormal; Notable for the following components:      Result Value   Calcium 8.8 (*)    All other components within normal limits  URINALYSIS, ROUTINE W REFLEX MICROSCOPIC - Abnormal; Notable for the following components:   Hgb urine dipstick SMALL (*)    All other  components within normal limits  LIPASE, BLOOD  CBC  I-STAT BETA HCG BLOOD, ED ([facility], [facility], [facility] ONLY)    EKG None  Radiology No results found.  Procedures Procedures (including critical care time)  Medications Ordered in ED Medications - No data to display   Initial Impression / Assessment and Plan / ED Course  I have reviewed the triage vital signs and the nursing notes.  Pertinent labs & imaging results that were available during my care of the patient were reviewed by me and considered in my medical  decision making (see chart for details).        Patient seen and examined.  Work-up is reassuring.  Patient appears well, comfortable on exam.  No signs of peritonitis.  UA with small hemoglobin, 0-5 red cells.  Patient is at the end of her menstrual period I suspect that this is contamination.  At this point, no compelling indications for further work-up.  We will treat symptoms.  Will have patient follow-up with her doctor.  Vital signs reviewed and are as follows: BP 135/86 (BP Location: Right Arm)   Pulse (!) 57   Temp 98.2 F (36.8 C) (Oral)   Resp 14   SpO2 100%   The patient was urged to return to the Emergency Department immediately with worsening of current symptoms, worsening abdominal pain, persistent vomiting, blood noted in stools, fever, or any other concerns. The patient verbalized understanding.     Final Clinical Impressions(s) / ED Diagnoses   Final diagnoses:  Left upper quadrant abdominal pain   Patient with abdominal pain, LUQ and left flank.  No significant tenderness on exam.  Vitals are stable, no fever. Labs reassuring, no concerning abnormalities. Imaging not indicated today. No signs of dehydration, patient is tolerating PO's. Lungs are clear and no signs suggestive of PNA. Low concern for appendicitis, cholecystitis, pancreatitis, ruptured viscus, UTI, kidney stone, aortic dissection, aortic aneurysm or other emergent abdominal etiology. Supportive  therapy indicated with return if symptoms worsen.    ED Discharge Orders         Ordered    famotidine (PEPCID) 20 MG tablet  2 times daily     04/26/19 1300    dicyclomine (BENTYL) 20 MG tablet  2 times daily     04/26/19 1300           Carlisle Cater, PA-C 04/26/19 1306    Lajean Saver, MD 04/26/19 1441

## 2019-04-26 NOTE — Discharge Instructions (Signed)
Please read and follow all provided instructions.  Your diagnoses today include:  1. Left upper quadrant abdominal pain     Tests performed today include:  Blood counts and electrolytes  Blood tests to check liver and kidney function  Blood tests to check pancreas function  Urine test to look for infection and pregnancy (in women)  Vital signs. See below for your results today.   Medications prescribed:   Pepcid (famotidine) - antihistamine  You can find this medication over-the-counter.   DO NOT exceed:   20mg  Pepcid every 12 hours   Bentyl - medication for intestinal cramps and spasms  Take any prescribed medications only as directed.  Home care instructions:   Follow any educational materials contained in this packet.  Follow-up instructions: Please follow-up with your primary care provider in the next 3 days for further evaluation of your symptoms.    Return instructions:  SEEK IMMEDIATE MEDICAL ATTENTION IF:  The pain does not go away or becomes severe   A temperature above 101F develops   Repeated vomiting occurs (multiple episodes)   The pain becomes localized to portions of the abdomen. The right side could possibly be appendicitis. In an adult, the left lower portion of the abdomen could be colitis or diverticulitis.   Blood is being passed in stools or vomit (bright red or black tarry stools)   You develop chest pain, difficulty breathing, dizziness or fainting, or become confused, poorly responsive, or inconsolable (young children)  If you have any other emergent concerns regarding your health  Additional Information: Abdominal (belly) pain can be caused by many things. Your caregiver performed an examination and possibly ordered blood/urine tests and imaging (CT scan, x-rays, ultrasound). Many cases can be observed and treated at home after initial evaluation in the emergency department. Even though you are being discharged home, abdominal pain  can be unpredictable. Therefore, you need a repeated exam if your pain does not resolve, returns, or worsens. Most patients with abdominal pain don't have to be admitted to the hospital or have surgery, but serious problems like appendicitis and gallbladder attacks can start out as nonspecific pain. Many abdominal conditions cannot be diagnosed in one visit, so follow-up evaluations are very important.  Your vital signs today were: BP 135/86 (BP Location: Right Arm)    Pulse (!) 57    Temp 98.2 F (36.8 C) (Oral)    Resp 14    SpO2 100%  If your blood pressure (bp) was elevated above 135/85 this visit, please have this repeated by your doctor within one month. --------------

## 2019-05-11 ENCOUNTER — Other Ambulatory Visit: Payer: Self-pay

## 2019-05-11 DIAGNOSIS — Z20822 Contact with and (suspected) exposure to covid-19: Secondary | ICD-10-CM

## 2019-05-12 LAB — NOVEL CORONAVIRUS, NAA: SARS-CoV-2, NAA: NOT DETECTED

## 2019-05-19 DIAGNOSIS — R87619 Unspecified abnormal cytological findings in specimens from cervix uteri: Secondary | ICD-10-CM | POA: Insufficient documentation

## 2019-05-19 DIAGNOSIS — E669 Obesity, unspecified: Secondary | ICD-10-CM | POA: Insufficient documentation

## 2019-06-18 ENCOUNTER — Encounter: Payer: Self-pay | Admitting: Medical

## 2019-06-18 ENCOUNTER — Other Ambulatory Visit: Payer: Self-pay

## 2019-06-18 ENCOUNTER — Ambulatory Visit: Payer: BC Managed Care – PPO | Admitting: Medical

## 2019-06-18 VITALS — BP 122/83 | HR 66 | Temp 97.7°F | Ht 62.5 in | Wt 198.0 lb

## 2019-06-18 DIAGNOSIS — N644 Mastodynia: Secondary | ICD-10-CM

## 2019-06-18 NOTE — Patient Instructions (Addendum)
Mammogram A mammogram is an X-ray of the breasts that is done to check for changes that are not normal. This test can screen for and find any changes that may suggest breast cancer. Mammograms are regularly done on women. A man may have a mammogram if he has a lump or swelling in his breast. This test can also help to find other changes and variations in the breast. Tell a doctor:  About any allergies you have.  If you have breast implants.  If you have had previous breast disease, biopsy, or surgery.  If you are breastfeeding.  If you are younger than age 45.  If you have a family history of breast cancer.  Whether you are pregnant or may be pregnant. What are the risks? Generally, this is a safe procedure. However, problems may occur, including:  Exposure to radiation. Radiation levels are very low with this test.  The results being misinterpreted.  The need for further tests.  The inability of the mammogram to detect certain cancers. What happens before the procedure?  Have this test done about 1-2 weeks after your period. This is usually when your breasts are the least tender.  If you are visiting a new doctor or clinic, send any past mammogram images to your new doctor's office.  Wash your breasts and under your arms the day of the test.  Do not use deodorants, perfumes, lotions, or powders on the day of the test.  Take off any jewelry from your neck.  Wear clothes that you can change into and out of easily. What happens during the procedure?   You will undress from the waist up. You will put on a gown.  You will stand in front of the X-ray machine.  Each breast will be placed between two plastic or glass plates. The plates will press down on your breast for a few seconds. Try to stay as relaxed as possible. This does not cause any harm to your breasts. Any discomfort you feel will be very brief.  X-rays will be taken from different angles of each breast. The  procedure may vary among doctors and hospitals. What happens after the procedure?  The mammogram will be read by a specialist (radiologist).  You may need to do certain parts of the test again. This depends on the quality of the images.  Ask when your test results will be ready. Make sure you get your test results.  You may go back to your normal activities. Summary  A mammogram is a low energy X-ray of the breasts that is done to check for abnormal changes. A man may have this test if he has a lump or swelling in his breast.  Before the procedure, tell your doctor about any breast problems that you have had in the past.  Have this test done about 1-2 weeks after your period.  For the test, each breast will be placed between two plastic or glass plates. The plates will press down on your breast for a few seconds.  The mammogram will be read by a specialist (radiologist). Ask when your test results will be ready. Make sure you get your test results. This information is not intended to replace advice given to you by your health care provider. Make sure you discuss any questions you have with your health care provider. Document Released: 09/20/2008 Document Revised: 02/12/2018 Document Reviewed: 02/12/2018 Elsevier Patient Education  Industry.  Breast Ultrasound  Breast ultrasound, or breast ultrasonogram, is  a procedure that is used to check for breast problems. A breast ultrasound uses harmless sound waves and a computer to create pictures of the inside of your breast. This procedure can help determine whether a lump in your breast is a fluid-filled sac (cyst), a solid tumor, or a growth. It can also help determine whether a tumor is cancerous (malignant) or noncancerous (benign). Sometimes a breast ultrasound is done to locate nodules in the breast that are too small to feel but need to be removed during surgery. A breast ultrasound takes about 30 minutes. Tell a health care  provider about:  Any allergies you have.  All medicines you are taking, including vitamins, herbs, eye drops, creams, and over-the-counter medicines.  Any blood disorders you have.  Any surgeries you have had.  Any medical conditions you have. What are the risks? Breast ultrasound is safe and painless. Ultrasound imaging does not use X-rays. There are no known risks for this procedure. What happens before the procedure? You do not need to prepare for a breast ultrasound. You will undress from your waist up, so wear loose, comfortable clothing on the day of the procedure. What happens during the procedure?  You will lie down on an examining table.  A health care provider will apply gel to your breast area.  You may be asked to hold your arm above your head.  The health care provider will move a handheld probe, which looks like a microphone, back and forth over your breast.  The probe will send signals to a computer that will create images of your breast.  When the procedure is over, the gel will be cleaned from your breast, and you can get dressed. What happens after the procedure?  You can go home right away and do all of your usual activities.  It is up to you to get the results of your procedure. Ask your health care provider, or the department that is doing the procedure, when your results will be ready. Summary  A breast ultrasound is a procedure that is used to check for breast problems.  This procedure uses harmless sound waves and a computer to create pictures of the inside of your breast.  This is a safe procedure. This information is not intended to replace advice given to you by your health care provider. Make sure you discuss any questions you have with your health care provider. Document Released: 07/16/2004 Document Revised: 06/06/2017 Document Reviewed: 02/26/2016 Elsevier Patient Education  2020 [employer].

## 2019-06-18 NOTE — Progress Notes (Signed)
History:  Ms. BARRI PIETZ is a 28 y.o. [id number] who presents to clinic today for breast pain and sore. The patient states that for the last 2 days she has noted a red area that is open without active drainage on her left breast. It is somewhat painful. She denies fever. She is not breastfeeding. She has also noted pain above her right breast for the past two weeks. She denies abnormal nipple drainage. She denies injury to the area. She has never had breast imaging in the past. She is concerned given her maternal grandmother and aunt have recently had breast cancer.    The following portions of the patient's history were reviewed and updated as appropriate: allergies, current medications, family history, past medical history, social history, past surgical history and problem list.  Review of Systems:  Review of Systems  Constitutional: Negative for fever and malaise/fatigue.  Genitourinary:       + breast pain bilateral + breast sore, left      Objective:  Physical Exam BP 122/83   Pulse 66   Temp 97.7 F (36.5 C)   Ht 5' 2.5" (1.588 m)   Wt 198 lb (89.8 kg)   LMP 06/08/2019   BMI 35.64 kg/m  Physical Exam  Nursing note and vitals reviewed. Constitutional: She is oriented to person, place, and time. She appears well-developed and well-nourished. No distress.  HENT:  Head: Normocephalic and atraumatic.  Cardiovascular: Normal rate.  Respiratory: Effort normal. Right breast exhibits tenderness (mild at 12:00). Right breast exhibits no inverted nipple, no mass, no nipple discharge and no skin change. Left breast exhibits skin change (1 cm area of ulceration with surrounding erythema noted at 7:00 without drainage). Left breast exhibits no inverted nipple, no mass, no nipple discharge and no tenderness. Breasts are symmetrical.  GI: Soft.  Neurological: She is alert and oriented to person, place, and time.  Skin: Skin is warm and dry. No erythema.  Psychiatric: She has a normal mood and  affect.    Assessment & Plan:  1. Breast pain - US BREAST LTD UNI LEFT INC AXILLA; Future - US BREAST LTD UNI RIGHT INC AXILLA; Future - [facility] [place] should determine if need for mammogram.  - Will wait for imaging to determine if Abx is needed.  - Patient advised of warning signs for infection and to go to [facility] if symptoms arise - RTC PRN  Luvenia Redden, PA-C 06/18/2019 11:52 AM

## 2019-06-18 NOTE — Progress Notes (Signed)
GYN presents for red sore on left breast, pain in left breast 6/10 x 2 days also right breast pain 7-8/10 x 1 week.  Her Maternal Grandmother had Breast Cancer.

## 2019-06-22 ENCOUNTER — Ambulatory Visit
Admission: RE | Admit: 2019-06-22 | Discharge: 2019-06-22 | Disposition: A | Discharge status: Home / Self Care | Payer: BC Managed Care – PPO | Source: Ambulatory Visit | Attending: Medical | Admitting: Medical

## 2019-06-22 ENCOUNTER — Other Ambulatory Visit: Payer: Self-pay

## 2019-06-22 DIAGNOSIS — N644 Mastodynia: Secondary | ICD-10-CM

## 2019-11-04 ENCOUNTER — Ambulatory Visit (INDEPENDENT_AMBULATORY_CARE_PROVIDER_SITE_OTHER): Payer: BC Managed Care – PPO | Admitting: Obstetrics

## 2019-11-04 ENCOUNTER — Encounter: Payer: Self-pay | Admitting: Obstetrics

## 2019-11-04 ENCOUNTER — Other Ambulatory Visit: Payer: Self-pay

## 2019-11-04 ENCOUNTER — Other Ambulatory Visit (HOSPITAL_COMMUNITY)
Admission: RE | Admit: 2019-11-04 | Discharge: 2019-11-04 | Disposition: A | Discharge status: Home / Self Care | Payer: BC Managed Care – PPO | Source: Ambulatory Visit | Attending: Obstetrics | Admitting: Obstetrics

## 2019-11-04 VITALS — BP 132/87 | HR 67 | Ht 62.5 in | Wt 188.6 lb

## 2019-11-04 DIAGNOSIS — B9689 Other specified bacterial agents as the cause of diseases classified elsewhere: Secondary | ICD-10-CM

## 2019-11-04 DIAGNOSIS — N760 Acute vaginitis: Secondary | ICD-10-CM

## 2019-11-04 DIAGNOSIS — Z113 Encounter for screening for infections with a predominantly sexual mode of transmission: Secondary | ICD-10-CM | POA: Diagnosis not present

## 2019-11-04 DIAGNOSIS — Z01419 Encounter for gynecological examination (general) (routine) without abnormal findings: Secondary | ICD-10-CM | POA: Diagnosis present

## 2019-11-04 DIAGNOSIS — D069 Carcinoma in situ of cervix, unspecified: Secondary | ICD-10-CM

## 2019-11-04 DIAGNOSIS — N898 Other specified noninflammatory disorders of vagina: Secondary | ICD-10-CM

## 2019-11-04 DIAGNOSIS — Z9889 Other specified postprocedural states: Secondary | ICD-10-CM

## 2019-11-04 NOTE — Progress Notes (Signed)
Patient presents for AEX. Patient has no concerns today.  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

## 2019-11-04 NOTE — Progress Notes (Addendum)
Subjective:        Julia Mayer is a 29 y.o. female here for a routine exam.  Current complaints: None.    Personal health questionnaire:  Is patient Ashkenazi Jewish, have a family history of breast and/or ovarian cancer: yes Is there a family history of uterine cancer diagnosed at age < 91, gastrointestinal cancer, urinary tract cancer, family member who is a Field seismologist syndrome-associated carrier: no Is the patient overweight and hypertensive, family history of diabetes, personal history of gestational diabetes, preeclampsia or PCOS: no Is patient over 7, have PCOS,  family history of premature CHD under age 42, diabetes, smoke, have hypertension or peripheral artery disease:  no At any time, has a partner hit, kicked or otherwise hurt or frightened you?: no Over the past 2 weeks, have you felt down, depressed or hopeless?: no Over the past 2 weeks, have you felt little interest or pleasure in doing things?:no   Gynecologic History Patient's last menstrual period was 10/18/2019. Contraception: none Last Pap: 06-26-2018. Results were: HGSIL.  S/P LEEP Last mammogram: n/a. Results were: n/a  Obstetric History OB History  Gravida Para Term Preterm AB Living  5 2 2  0 3 2  SAB TAB Ectopic Multiple Live Births  0 3 0 0 2    # Outcome Date GA Lbr Len/2nd Weight Sex Delivery Anes PTL Lv  5 Term 07/25/16 [redacted]w[redacted]d 12:23 / 00:17 7 lb 12.2 oz (3.52 kg) F Vag-Spont EPI  LIV  4 Term 07/20/11 [redacted]w[redacted]d 375:02 / 00:27  F Vag-Spont   LIV  3 TAB           2 TAB           1 TAB             Past Medical History:  Diagnosis Date  . Chlamydia   . Gallstones 06/19/2018  . No pertinent past medical history   . Normal pregnancy 07/19/2011  . SVD (spontaneous vaginal delivery) 07/20/2011  . Urinary tract infection during pregnancy 06/15/2011  . UTI (urinary tract infection)     Past Surgical History:  Procedure Laterality Date  . CHOLECYSTECTOMY N/A 06/19/2018   Procedure: LAPAROSCOPIC  CHOLECYSTECTOMY WITH INTRAOPERATIVE CHOLANGIOGRAM ERAS PATHWAY;  Surgeon: Fanny Skates, MD;  Location: [facility];  Service: General;  Laterality: N/A;  . ESOPHAGOGASTRODUODENOSCOPY N/A 06/11/2018   Procedure: ESOPHAGOGASTRODUODENOSCOPY (EGD);  Surgeon: Milus Banister, MD;  Location: [facility];  Service: Endoscopy;  Laterality: N/A;  . EUS N/A 06/11/2018   Procedure: UPPER ENDOSCOPIC ULTRASOUND (EUS) RADIAL;  Surgeon: Milus Banister, MD;  Location: [facility];  Service: Endoscopy;  Laterality: N/A;  . 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   Marland Kitchen FINE NEEDLE ASPIRATION N/A 06/11/2018   Procedure: FINE NEEDLE ASPIRATION (FNA) LINEAR;  Surgeon: Milus Banister, MD;  Location: [facility];  Service: Endoscopy;  Laterality: N/A;  . INDUCED ABORTION       Current Outpatient Medications:  .  dicyclomine (BENTYL) 20 MG tablet, Take 1 tablet (20 mg total) by mouth 2 (two) times daily., Disp: 20 tablet, Rfl: 0 .  famotidine (PEPCID) 20 MG tablet, Take 1 tablet (20 mg total) by mouth 2 (two) times daily., Disp: 30 tablet, Rfl: 0 No Known Allergies  Social History   Tobacco Use  . Smoking status: Never Smoker  . Smokeless tobacco: Never Used  Substance Use Topics  . Alcohol use: No    Comment: occasionally    Family  History  Problem Relation Age of Onset  . Hypertension Mother   . Asthma Mother   . Alcohol abuse Mother   . Hypertension Father   . Hyperlipidemia Father   . Breast cancer Maternal Grandmother   . Cancer Paternal Grandmother   . Anesthesia problems Paternal Grandmother       Review of Systems  Constitutional: negative for fatigue and weight loss Respiratory: negative for cough and wheezing Cardiovascular: negative for chest pain, fatigue and palpitations Gastrointestinal: negative for abdominal pain and change in bowel habits Musculoskeletal:negative for myalgias Neurological: negative for gait problems and tremors Behavioral/Psych: negative for abusive  relationship, depression Endocrine: negative for temperature intolerance    Genitourinary:negative for abnormal menstrual periods, genital lesions, hot flashes, sexual problems and vaginal discharge Integument/breast: negative for breast lump, breast tenderness, nipple discharge and skin lesion(s)    Objective:       BP 132/87   Pulse 67   Ht 5' 2.5" (1.588 m)   Wt 188 lb 9.6 oz (85.5 kg)   LMP 10/18/2019   BMI 33.95 kg/m  General:   alert  Skin:   no rash or abnormalities  Lungs:   clear to auscultation bilaterally  Heart:   regular rate and rhythm, S1, S2 normal, no murmur, click, rub or gallop  Breasts:   normal without suspicious masses, skin or nipple changes or axillary nodes  Abdomen:  normal findings: no organomegaly, soft, non-tender and no hernia  Pelvis:  External genitalia: normal general appearance Urinary system: urethral meatus normal and bladder without fullness, nontender Vaginal: normal without tenderness, induration or masses Cervix: normal appearance Adnexa: normal bimanual exam Uterus: anteverted and non-tender, normal size   Lab Review Urine pregnancy test Labs reviewed yes Radiologic studies reviewed no  50% of 20 min visit spent on counseling and coordination of care.   Assessment:     1. Encounter for routine gynecological examination with Papanicolaou smear of cervix Rx: - Cytology - PAP( [facility])  2. Vaginal discharge Rx: - Cervicovaginal ancillary only( [facility])  3. Screen for STD (sexually transmitted disease) Rx: - Hepatitis B surface antigen - Hepatitis C antibody - HIV Antibody (routine testing w rflx) - RPR  4. CIN III with severe dysplasia  5. S/P LEEP    Plan:    Education reviewed: calcium supplements, depression evaluation, low fat, low cholesterol diet, safe sex/STD prevention, self breast exams and weight bearing exercise. Contraception: none. Follow up in: 1 year.   No orders of the defined types were  placed in this encounter.  Orders Placed This Encounter  Procedures  . Hepatitis B surface antigen  . Hepatitis C antibody  . HIV Antibody (routine testing w rflx)  . RPR    Shelly Bombard, MD 11/04/2019 10:12 AM

## 2019-11-05 LAB — HIV ANTIBODY (ROUTINE TESTING W REFLEX): HIV Screen 4th Generation wRfx: NONREACTIVE

## 2019-11-05 LAB — CERVICOVAGINAL ANCILLARY ONLY
Bacterial Vaginitis (gardnerella): POSITIVE — AB
Candida Glabrata: NEGATIVE
Candida Vaginitis: NEGATIVE
Chlamydia: NEGATIVE
Comment: NEGATIVE
Comment: NEGATIVE
Comment: NEGATIVE
Comment: NEGATIVE
Comment: NEGATIVE
Comment: NORMAL
Neisseria Gonorrhea: NEGATIVE
Trichomonas: NEGATIVE

## 2019-11-05 LAB — RPR: RPR Ser Ql: NONREACTIVE

## 2019-11-05 LAB — HEPATITIS B SURFACE ANTIGEN: Hepatitis B Surface Ag: NEGATIVE

## 2019-11-05 LAB — HEPATITIS C ANTIBODY: Hep C Virus Ab: <0.1 s/co ratio (ref 0.0–0.9)

## 2019-11-06 ENCOUNTER — Other Ambulatory Visit: Payer: Self-pay | Admitting: Obstetrics

## 2019-11-06 DIAGNOSIS — N760 Acute vaginitis: Secondary | ICD-10-CM

## 2019-11-06 DIAGNOSIS — B9689 Other specified bacterial agents as the cause of diseases classified elsewhere: Secondary | ICD-10-CM

## 2019-11-06 MED ORDER — TINIDAZOLE 500 MG PO TABS: 1000.0000 mg | ORAL_TABLET | Freq: Every day | ORAL | 2 refills | Status: DC

## 2020-01-28 ENCOUNTER — Emergency Department (HOSPITAL_COMMUNITY)
Admission: EM | Admit: 2020-01-28 | Discharge: 2020-01-28 | Disposition: A | Discharge status: Home / Self Care | Payer: BC Managed Care – PPO | Attending: Emergency Medicine | Admitting: Emergency Medicine

## 2020-01-28 ENCOUNTER — Other Ambulatory Visit: Payer: Self-pay

## 2020-01-28 ENCOUNTER — Encounter (HOSPITAL_COMMUNITY): Payer: Self-pay | Admitting: Pediatrics

## 2020-01-28 DIAGNOSIS — Z5321 Procedure and treatment not carried out due to patient leaving prior to being seen by health care provider: Secondary | ICD-10-CM | POA: Insufficient documentation

## 2020-01-28 DIAGNOSIS — H5789 Other specified disorders of eye and adnexa: Secondary | ICD-10-CM | POA: Insufficient documentation

## 2020-01-28 NOTE — ED Triage Notes (Signed)
C/o bilateral eye swelling + redness, started today.

## 2020-09-04 ENCOUNTER — Encounter: Payer: Self-pay | Admitting: Obstetrics

## 2020-09-04 ENCOUNTER — Ambulatory Visit (INDEPENDENT_AMBULATORY_CARE_PROVIDER_SITE_OTHER): Payer: BC Managed Care – PPO | Admitting: Obstetrics

## 2020-09-04 ENCOUNTER — Other Ambulatory Visit: Payer: Self-pay

## 2020-09-04 ENCOUNTER — Emergency Department (HOSPITAL_COMMUNITY)
Admission: EM | Admit: 2020-09-04 | Discharge: 2020-09-04 | Disposition: A | Discharge status: Home / Self Care | Payer: BC Managed Care – PPO | Attending: Emergency Medicine | Admitting: Emergency Medicine

## 2020-09-04 ENCOUNTER — Encounter (HOSPITAL_COMMUNITY): Payer: Self-pay | Admitting: Emergency Medicine

## 2020-09-04 VITALS — BP 156/100 | HR 71 | Ht 62.0 in | Wt 195.7 lb

## 2020-09-04 DIAGNOSIS — R102 Pelvic and perineal pain: Secondary | ICD-10-CM | POA: Diagnosis present

## 2020-09-04 DIAGNOSIS — B9689 Other specified bacterial agents as the cause of diseases classified elsewhere: Secondary | ICD-10-CM

## 2020-09-04 DIAGNOSIS — R030 Elevated blood-pressure reading, without diagnosis of hypertension: Secondary | ICD-10-CM

## 2020-09-04 DIAGNOSIS — N750 Cyst of Bartholin's gland: Secondary | ICD-10-CM

## 2020-09-04 DIAGNOSIS — N760 Acute vaginitis: Secondary | ICD-10-CM | POA: Diagnosis not present

## 2020-09-04 LAB — GC/CHLAMYDIA PROBE AMP (~~LOC~~) NOT AT ARMC
Chlamydia: NEGATIVE
Comment: NEGATIVE
Comment: NORMAL
Neisseria Gonorrhea: NEGATIVE

## 2020-09-04 LAB — WET PREP, GENITAL
Sperm: NONE SEEN
Trich, Wet Prep: NONE SEEN
Yeast Wet Prep HPF POC: NONE SEEN

## 2020-09-04 LAB — I-STAT BETA HCG BLOOD, ED (MC, WL, AP ONLY): I-stat hCG, quantitative: <5 m[IU]/mL (ref <5)

## 2020-09-04 MED ORDER — IBUPROFEN 800 MG PO TABS: 800.0000 mg | ORAL_TABLET | Freq: Three times a day (TID) | ORAL | 5 refills | Status: DC | PRN

## 2020-09-04 MED ORDER — NAPROXEN 375 MG PO TABS: 375.0000 mg | ORAL_TABLET | Freq: Two times a day (BID) | ORAL | 0 refills | Status: DC | PRN

## 2020-09-04 MED ORDER — SULFAMETHOXAZOLE-TRIMETHOPRIM 800-160 MG PO TABS: 1.0000 | ORAL_TABLET | Freq: Two times a day (BID) | ORAL | 0 refills | Status: AC

## 2020-09-04 MED ORDER — TINIDAZOLE 500 MG PO TABS: 1000.0000 mg | ORAL_TABLET | Freq: Every day | ORAL | 2 refills | Status: DC

## 2020-09-04 NOTE — Patient Instructions (Signed)
Bartholin's Cyst  A Bartholin's cyst is a fluid-filled sac that forms as a result of a blockage along the tube (duct) of the Bartholin's gland. Bartholin's glands are small glands in the folds of skin around the vaginal opening (labia). These glands produce fluid to moisten or lubricate the outside of the vagina during sex. A cyst that is not large or infected may not cause any problems or require treatment. If the cyst gets infected with bacteria, it is called a Bartholin's abscess. An abscess may cause symptoms such as pain and swelling and is more likely to require treatment. What are the causes? This condition may be caused by a blocked Bartholin's gland duct. These ducts can become blocked due to natural buildup of fluid and oils. Bacteria inside of the cyst can cause infection. In many cases, the cause is not known. What are the signs or symptoms? Symptoms may include:  A bulge or lump on the labia, near the lower opening of the vagina.  Discomfort or pain. This may get worse during sex or when walking.  Redness, swelling, or fluid draining from the area. These may be signs of an abscess. How severe your symptoms are depends on the size of your cyst and whether it is infected. Infection causes symptoms to get more severe. How is this diagnosed? This condition may be diagnosed based on:  Your symptoms and medical history.  A physical exam to check for swelling in your vaginal area. You may lie on your back on an exam table and have your feet placed into footrests for the exam.  Blood tests to check for infections.  Removal of a fluid sample from the cyst or abscess (biopsy) for testing. You may work with a health care provider who specializes in women's health (gynecologist) for diagnosis and treatment. How is this treated? If your cyst is small, not infected, and not causing symptoms, you may not need treatment. These cysts often go away on their own, with home care such as hot  baths or warm compresses. If you have a large cyst or an abscess, treatment may include:  Antibiotic medicine.  A procedure to drain the fluid inside the cyst or abscess. These procedures involve making an incision in the cyst or abscess so that the fluid drains out, and then one of the following may be done: ? A small, thin tube (catheter) may be placed inside the cyst or abscess so that it does not close and fill up with fluid again (fistulization). The catheter will be removed at a follow-up visit. ? The edges of the incision may be stitched to your skin so that the cyst or abscess stays open (marsupialization). This allows it to continue to drain and not fill up with fluid again. If you have cysts or abscesses that keep returning (recurring) and have required incision and drainage multiple times, your health care provider may talk with you about surgery to remove the Bartholin's gland. Follow these instructions at home: Medicines  Take over-the-counter and prescription medicines only as told by your health care provider.  If you were prescribed an antibiotic medicine, take it as told by your health care provider. Do not stop taking the antibiotic even if your condition improves. Managing pain and swelling  Try sitz baths to help with pain and swelling. A sitz bath is a warm water bath in which the water only comes up to your hips and should cover your buttocks. You may take sitz baths several times a   day.  Apply heat to the affected area as often as needed. Use the heat source that your health care provider recommends, such as a moist heat pack or a heating pad. ? Place a towel between your skin and the heat source. ? Leave the heat on for 20-30 minutes. ? Remove the heat if your skin turns bright red. This is especially important if you are unable to feel pain, heat, or cold. You may have a greater risk of getting burned. Do not fall asleep with the heating pad in place. General  instructions  If your cyst or abscess was drained, follow instructions from your health care provider about how to take care of your wound. Use feminine pads as needed to absorb any drainage.  Do not push on or squeeze your cyst.  Do not have sex until the cyst has gone away or your wound from drainage has healed.  Take these steps to help prevent a Bartholin's cyst from returning and to prevent other Bartholin's cysts from developing: ? Take a bath or shower once a day. Clean your vaginal area with mild soap and water when you bathe. ? Practice safe sex to prevent STIs. Talk with your health care provider about how to prevent STIs and which forms of birth control (contraception) may be best for you.  Keep all follow-up visits. This is important. Contact a health care provider if:  You have a fever.  You develop increasing redness, swelling, or pain around your cyst.  You have fluid, blood, pus, or a bad smell coming from your cyst.  You have a cyst that gets larger or comes back. Summary  A Bartholin's cyst is a fluid-filled sac that forms as a result of a blockage along the duct of the Bartholin's gland.  If your cyst is small, not infected, and not causing symptoms, you may not need any treatment.  If you have a large cyst or an abscess, your health care provider may perform a procedure to drain the fluid.  If you have cysts or abscesses that keep returning (recurring) and have required incision and drainage multiple times, your health care provider may talk with you about surgery to remove the Bartholin's gland. This information is not intended to replace advice given to you by your health care provider. Make sure you discuss any questions you have with your health care provider. Document Revised: 11/22/2019 Document Reviewed: 11/22/2019 Elsevier Patient Education  2021 [place].  Bacterial Vaginosis  Bacterial vaginosis is an infection that occurs when the normal  balance of bacteria in the vagina changes. This change is caused by an overgrowth of certain bacteria in the vagina. Bacterial vaginosis is the most common vaginal infection among females aged 15 to 50 years. This condition increases the risk of sexually transmitted infections (STIs). Treatment can help reduce this risk. Treatment is very important for pregnant women because this condition can cause babies to be born early (prematurely) or at a low birth weight. What are the causes? This condition is caused by an increase in harmful bacteria that are normally present in small amounts in the vagina. However, the exact reason this condition develops is not known. You cannot get bacterial vaginosis from toilet seats, bedding, swimming pools, or contact with objects around you. What increases the risk? The following factors may make you more likely to develop this condition:  Having a new sexual partner or multiple sexual partners, or having unprotected sex.  Douching.  Having an intrauterine device (  IUD).  Smoking.  Abusing drugs and alcohol. This may lead to riskier sexual behavior.  Taking certain antibiotic medicines.  Being pregnant. What are the signs or symptoms? Some women with this condition have no symptoms. Symptoms may include:  Pearline Cables or white vaginal discharge. The discharge can be watery or foamy.  A fish-like odor with discharge, especially after sex or during menstruation.  Itching in and around the vagina.  Burning or pain with urination. How is this diagnosed? This condition is diagnosed based on:  Your medical history.  A physical exam of the vagina.  Checking a sample of vaginal fluid for harmful bacteria or abnormal cells. How is this treated? This condition is treated with antibiotic medicines. These may be given as a pill, a vaginal cream, or a medicine that is put into the vagina (suppository). If the condition comes back after treatment, a second round of  antibiotics may be needed. Follow these instructions at home: Medicines  Take or apply over-the-counter and prescription medicines only as told by your health care provider.  Take or apply your antibiotic medicine as told by your health care provider. Do not stop using the antibiotic even if you start to feel better. General instructions  If you have a female sexual partner, tell her that you have a vaginal infection. She should follow up with her health care provider. If you have a female sexual partner, he does not need treatment.  Avoid sexual activity until you finish treatment.  Drink enough fluid to keep your urine pale yellow.  Keep the area around your vagina and rectum clean. ? Wash the area daily with warm water. ? Wipe yourself from front to back after using the toilet.  If you are breastfeeding, talk to your health care provider about continuing breastfeeding during treatment.  Keep all follow-up visits. This is important. How is this prevented? Self-care  Do not douche.  Wash the outside of your vagina with warm water only.  Wear cotton or cotton-lined underwear.  Avoid wearing tight pants and pantyhose, especially during the summer. Safe sex  Use protection when having sex. This includes: ? Using condoms. ? Using dental dams. This is a thin layer of a material made of latex or polyurethane that protects the mouth during oral sex.  Limit the number of sexual partners. To help prevent bacterial vaginosis, it is best to have sex with just one partner (monogamous relationship).  Make sure you and your sexual partner are tested for STIs. Drugs and alcohol  Do not use any products that contain nicotine or tobacco. These products include cigarettes, chewing tobacco, and vaping devices, such as e-cigarettes. If you need help quitting, ask your health care provider.  Do not use drugs.  Do not drink alcohol if: ? Your health care provider tells you not to do  this. ? You are pregnant, may be pregnant, or are planning to become pregnant.  If you drink alcohol: ? Limit how much you have to 0-1 drink a day. ? Be aware of how much alcohol is in your drink. In the [place] one drink equals one 12 oz bottle of beer (355 mL), one 5 oz glass of wine (148 mL), or one 1 oz glass of hard liquor (44 mL). Where to find more information  Centers for Disease Control and Prevention: http://www.wolf.info/  American Sexual Health Association (ASHA): www.ashastd.org  [place] Department of Health and Financial controller, Office on Women's Health: [place].tn Contact a health care provider if:  Your  symptoms do not improve, even after treatment.  You have more discharge or pain when urinating.  You have a fever or chills.  You have pain in your abdomen or pelvis.  You have pain during sex.  You have vaginal bleeding between menstrual periods. Summary  Bacterial vaginosis is a vaginal infection that occurs when the normal balance of bacteria in the vagina changes. It results from an overgrowth of certain bacteria.  This condition increases the risk of sexually transmitted infections (STIs). Getting treated can help reduce this risk.  Treatment is very important for pregnant women because this condition can cause babies to be born early (prematurely) or at low birth weight.  This condition is treated with antibiotic medicines. These may be given as a pill, a vaginal cream, or a medicine that is put into the vagina (suppository). This information is not intended to replace advice given to you by your health care provider. Make sure you discuss any questions you have with your health care provider. Document Revised: 12/23/2019 Document Reviewed: 12/23/2019 Elsevier Patient Education  [place].

## 2020-09-04 NOTE — ED Provider Notes (Signed)
[facility] EMERGENCY DEPARTMENT Provider Note   CSN: [id number] Arrival date & time: 09/04/20  0019     History Chief Complaint  Patient presents with  . Vaginal Pain    Julia Mayer is a 30 y.o. female with a hx of chlamydia and candida vaginitis who presents to the ED with complaints of painful bump to vagina x 1-2 days. No alleviating/aggravating factors. No drainage. Denies fever, chills, nausea, vomiting, abdominal pain, vaginal bleeding, or atypical vaginal discharge. Sexually active in a monogamous relationship without concern for STD.   HPI     Past Medical History:  Diagnosis Date  . Chlamydia   . Gallstones 06/19/2018  . No pertinent past medical history   . Normal pregnancy 07/19/2011  . SVD (spontaneous vaginal delivery) 07/20/2011  . Urinary tract infection during pregnancy 06/15/2011  . UTI (urinary tract infection)     Patient Active Problem List   Diagnosis Date Noted  . Gallstones 06/19/2018  . Abnormal CT of the abdomen   . Candida vaginitis 01/28/2011    Past Surgical History:  Procedure Laterality Date  . CHOLECYSTECTOMY N/A 06/19/2018   Procedure: LAPAROSCOPIC CHOLECYSTECTOMY WITH INTRAOPERATIVE CHOLANGIOGRAM ERAS PATHWAY;  Surgeon: Fanny Skates, MD;  Location: [facility];  Service: General;  Laterality: N/A;  . ESOPHAGOGASTRODUODENOSCOPY N/A 06/11/2018   Procedure: ESOPHAGOGASTRODUODENOSCOPY (EGD);  Surgeon: Milus Banister, MD;  Location: [facility];  Service: Endoscopy;  Laterality: N/A;  . EUS N/A 06/11/2018   Procedure: UPPER ENDOSCOPIC ULTRASOUND (EUS) RADIAL;  Surgeon: Milus Banister, MD;  Location: [facility];  Service: Endoscopy;  Laterality: N/A;  . 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   Marland Kitchen FINE NEEDLE ASPIRATION N/A 06/11/2018   Procedure: FINE NEEDLE ASPIRATION (FNA) LINEAR;  Surgeon: Milus Banister, MD;  Location: [facility];  Service: Endoscopy;  Laterality: N/A;  . INDUCED ABORTION       OB History     Gravida  5   Para  2   Term  2   Preterm  0   AB  3   Living  2     SAB  0   IAB  3   Ectopic  0   Multiple  0   Live Births  2           Family History  Problem Relation Age of Onset  . Hypertension Mother   . Asthma Mother   . Alcohol abuse Mother   . Hypertension Father   . Hyperlipidemia Father   . Breast cancer Maternal Grandmother   . Cancer Paternal Grandmother   . Anesthesia problems Paternal Grandmother     Social History   Tobacco Use  . Smoking status: Never Smoker  . Smokeless tobacco: Never Used  Vaping Use  . Vaping Use: Never used  Substance Use Topics  . Alcohol use: No    Comment: occasionally  . Drug use: No    Home Medications Prior to Admission medications   Medication Sig Start Date End Date Taking? Authorizing Provider  dicyclomine (BENTYL) 20 MG tablet Take 1 tablet (20 mg total) by mouth 2 (two) times daily. 04/26/19   Carlisle Cater, PA-C  famotidine (PEPCID) 20 MG tablet Take 1 tablet (20 mg total) by mouth 2 (two) times daily. 04/26/19   Carlisle Cater, PA-C  tinidazole (TINDAMAX) 500 MG tablet Take 2 tablets (1,000 mg total) by mouth daily with breakfast. 11/06/19   Shelly Bombard, MD  Allergies    Patient has no known allergies.  Review of Systems   Review of Systems  Constitutional: Negative for chills and fever.  Respiratory: Negative for shortness of breath.   Cardiovascular: Negative for chest pain.  Gastrointestinal: Negative for abdominal pain, diarrhea, nausea and vomiting.  Genitourinary: Positive for vaginal pain (bump). Negative for dysuria, vaginal bleeding and vaginal discharge (atypical).  All other systems reviewed and are negative.   Physical Exam Updated Vital Signs BP (!) 138/100 (BP Location: Left Arm)   Pulse 86   Temp 98.1 F (36.7 C) (Oral)   Resp 18   LMP 08/19/2020   SpO2 100%   Physical Exam Vitals and nursing note reviewed.  Constitutional:      General: She is not in acute  distress.    Appearance: She is well-developed. She is not toxic-appearing.  HENT:     Head: Normocephalic and atraumatic.  Eyes:     General:        Right eye: No discharge.        Left eye: No discharge.     Conjunctiva/sclera: Conjunctivae normal.  Cardiovascular:     Rate and Rhythm: Normal rate and regular rhythm.  Pulmonary:     Effort: Pulmonary effort is normal. No respiratory distress.     Breath sounds: Normal breath sounds. No wheezing, rhonchi or rales.  Abdominal:     General: There is no distension.     Palpations: Abdomen is soft.     Tenderness: There is no abdominal tenderness. There is no guarding or rebound.  Genitourinary:      Comments: No ulcerations or vesicles.  No crepitus Speculum exam with mild white discharge.  Bimanual without adnexal or cervical motion tenderness.   Chaperone RN Melissa present during exam.  Musculoskeletal:     Cervical back: Neck supple.  Skin:    General: Skin is warm and dry.     Findings: No rash.  Neurological:     Mental Status: She is alert.     Comments: Clear speech.   Psychiatric:        Behavior: Behavior normal.     Photo taken with patient's consent.   ED Results / Procedures / Treatments   Labs (all labs ordered are listed, but only abnormal results are displayed) Labs Reviewed  WET PREP, GENITAL  I-STAT BETA HCG BLOOD, ED ([facility], [facility], [facility] ONLY)  GC/CHLAMYDIA PROBE AMP ([place]) NOT AT [facility]    EKG None  Radiology No results found.  Procedures Procedures   Medications Ordered in ED Medications - No data to display  ED Course  I have reviewed the triage vital signs and the nursing notes.  Pertinent labs & imaging results that were available during my care of the patient were reviewed by me and considered in my medical decision making (see chart for details).    MDM Rules/Calculators/A&P                          Patient presents to the ED with complaints of painful area to her vagina.    Additional history obtained:  Additional history obtained from chart review & nursing note review.   Lab Tests:  I Ordered, reviewed, and interpreted labs, which included:  Wet prep: Clue cells- patient denies any atypical discharge therefore will not tx for BV Preg test: Negative  ED Course:  No abdominal pain or tenderness w/ bimanual- doubt PID.  Preg test negative.  Small nodule like structure noted to the left labia minora, tender to palpation. No significant fluctuance to indicate ED I&D. Exam not consistent w/ fournier's gangrene. Does not seem consistent w/ HSV. Unclear definitive etiology, will cover for possible infectious process with bactrim. OBGYN follow up. I discussed results, treatment plan, need for follow-up, and return precautions with the patient. Provided opportunity for questions, patient confirmed understanding and is in agreement with plan.   Findings and plan of care discussed with supervising physician Dr. Randal Buba who is in agreement.   Portions of this note were generated with [employer]. Dictation errors may occur despite best attempts at proofreading.  Final Clinical Impression(s) / ED Diagnoses Final diagnoses:  Vaginal pain    Rx / DC Orders ED Discharge Orders         Ordered    sulfamethoxazole-trimethoprim (BACTRIM DS) 800-160 MG tablet  2 times daily        09/04/20 0355    naproxen (NAPROSYN) 375 MG tablet  2 times daily PRN        09/04/20 0358           Petrucelli, Glynda Jaeger, PA-C 09/04/20 0408    Palumbo, April, MD 09/04/20 [phone]

## 2020-09-04 NOTE — Progress Notes (Addendum)
Patient ID: Julia Mayer, female   DOB: [birth year], 30 y.o.   MRN: [id number]   HPI Julia Mayer is a 30 y.o. female.  Painful swelling of lower vagina. HPI  Past Medical History:  Diagnosis Date  . Chlamydia   . Gallstones 06/19/2018  . No pertinent past medical history   . Normal pregnancy 07/19/2011  . SVD (spontaneous vaginal delivery) 07/20/2011  . Urinary tract infection during pregnancy 06/15/2011  . UTI (urinary tract infection)     Past Surgical History:  Procedure Laterality Date  . CHOLECYSTECTOMY N/A 06/19/2018   Procedure: LAPAROSCOPIC CHOLECYSTECTOMY WITH INTRAOPERATIVE CHOLANGIOGRAM ERAS PATHWAY;  Surgeon: Fanny Skates, MD;  Location: [facility];  Service: General;  Laterality: N/A;  . ESOPHAGOGASTRODUODENOSCOPY N/A 06/11/2018   Procedure: ESOPHAGOGASTRODUODENOSCOPY (EGD);  Surgeon: Milus Banister, MD;  Location: [facility];  Service: Endoscopy;  Laterality: N/A;  . EUS N/A 06/11/2018   Procedure: UPPER ENDOSCOPIC ULTRASOUND (EUS) RADIAL;  Surgeon: Milus Banister, MD;  Location: [facility];  Service: Endoscopy;  Laterality: N/A;  . 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   Marland Kitchen FINE NEEDLE ASPIRATION N/A 06/11/2018   Procedure: FINE NEEDLE ASPIRATION (FNA) LINEAR;  Surgeon: Milus Banister, MD;  Location: [facility];  Service: Endoscopy;  Laterality: N/A;  . INDUCED ABORTION      Family History  Problem Relation Age of Onset  . Hypertension Mother   . Asthma Mother   . Alcohol abuse Mother   . Hypertension Father   . Hyperlipidemia Father   . Breast cancer Maternal Grandmother   . Cancer Paternal Grandmother   . Anesthesia problems Paternal Grandmother     Social History Social History   Tobacco Use  . Smoking status: Never Smoker  . Smokeless tobacco: Never Used  Vaping Use  . Vaping Use: Never used  Substance Use Topics  . Alcohol use: No    Comment: occasionally  . Drug use: No    No Known Allergies  Current Outpatient Medications   Medication Sig Dispense Refill  . dicyclomine (BENTYL) 20 MG tablet Take 1 tablet (20 mg total) by mouth 2 (two) times daily. 20 tablet 0  . famotidine (PEPCID) 20 MG tablet Take 1 tablet (20 mg total) by mouth 2 (two) times daily. 30 tablet 0  . naproxen (NAPROSYN) 375 MG tablet Take 1 tablet (375 mg total) by mouth 2 (two) times daily as needed for moderate pain. 15 tablet 0  . sulfamethoxazole-trimethoprim (BACTRIM DS) 800-160 MG tablet Take 1 tablet by mouth 2 (two) times daily for 7 days. 14 tablet 0  . tinidazole (TINDAMAX) 500 MG tablet Take 2 tablets (1,000 mg total) by mouth daily with breakfast. 10 tablet 2   No current facility-administered medications for this visit.    Review of Systems Review of Systems Constitutional: negative for fatigue and weight loss Respiratory: negative for cough and wheezing Cardiovascular: negative for chest pain, fatigue and palpitations Gastrointestinal: negative for abdominal pain and change in bowel habits Genitourinary: positive for painful left Bartholin Cyst Integument/breast: negative for nipple discharge Musculoskeletal:negative for myalgias Neurological: negative for gait problems and tremors Behavioral/Psych: negative for abusive relationship, depression Endocrine: negative for temperature intolerance      Blood pressure (!) 156/100, pulse 73, height 5\' 2"  (1.575 m), weight 195 lb 11.2 oz (88.8 kg), last menstrual period 08/19/2020.  Physical Exam Physical Exam           General:  Alert and no distress  Abdomen:  normal findings: no organomegaly, soft, non-tender and no hernia  Pelvis:  External genitalia: left Bartholin Cyst Urinary system: urethral meatus normal and bladder without fullness, nontender Vaginal: normal without tenderness, induration or masses Cervix: normal appearance Adnexa: normal bimanual exam Uterus: anteverted and non-tender, normal size    50% of 20 min visit spent on counseling and coordination of care.    Data Reviewed Wet Prep  Assessment     1. Cyst of left Bartholin's gland Rx: - ibuprofen (ADVIL) 800 MG tablet; Take 1 tablet (800 mg total) by mouth every 8 (eight) hours as needed.  Dispense: 30 tablet; Refill: 5  2. BV (bacterial vaginosis) Rx: - tinidazole (TINDAMAX) 500 MG tablet; Take 2 tablets (1,000 mg total) by mouth daily with breakfast.  Dispense: 10 tablet; Refill: 2  3. Elevated BP without diagnosis of hypertension - referred to PCP    Plan   Follow up in 4 weeks  No orders of the defined types were placed in this encounter.  Meds ordered this encounter  Medications  . tinidazole (TINDAMAX) 500 MG tablet    Sig: Take 2 tablets (1,000 mg total) by mouth daily with breakfast.    Dispense:  10 tablet    Refill:  2      Shelly Bombard, MD 09/04/2020 2:47 PM

## 2020-09-04 NOTE — Progress Notes (Signed)
Pt has cyst in vaginal area, was seen in ED last night and was told to follow up with GYN today.  BP is 156/100 2nd BP reading is 156/100 Pt c/o of headache and tingling in fingers BP in ED last night was 134/100

## 2020-09-04 NOTE — Discharge Instructions (Addendum)
You were seen in the ER today for a painful vaginal area.  We are starting you on bactrim, an antibiotic, to treat for possible infectious cause. Please take this as prescribed.  We are also sending you home with naproxen to help with pain.  - Naproxen is a nonsteroidal anti-inflammatory medication that will help with pain and swelling. Be sure to take this medication as prescribed with food, 1 pill every 12 hours,  It should be taken with food, as it can cause stomach upset, and more seriously, stomach bleeding. Do not take other nonsteroidal anti-inflammatory medications with this such as Advil, Motrin, Aleve, Mobic, Goodie Powder, or Motrin.    You make take Tylenol per over the counter dosing with these medications.   We have prescribed you new medication(s) today. Discuss the medications prescribed today with your pharmacist as they can have adverse effects and interactions with your other medicines including over the counter and prescribed medications. Seek medical evaluation if you start to experience new or abnormal symptoms after taking one of these medicines, seek care immediately if you start to experience difficulty breathing, feeling of your throat closing, facial swelling, blisters, or rash as these could be indications of a more serious allergic reaction  Please apply warm compresses to the area.  Please follow up with obgyn- call tomorrow morning to make an appointment as soon as possible for follow up.  Return to the ER for new or worsening symptoms including but not limited to increased pain, increased swelling, fever, redness, drainage, or any other concerns.

## 2020-09-04 NOTE — ED Triage Notes (Signed)
Pt c/o vaginal pain that started yesterday. Denies abnormal discharge/bleeding.

## 2020-09-05 ENCOUNTER — Other Ambulatory Visit (HOSPITAL_COMMUNITY)
Admission: RE | Admit: 2020-09-05 | Discharge: 2020-09-05 | Disposition: A | Discharge status: Home / Self Care | Payer: BC Managed Care – PPO | Source: Ambulatory Visit | Attending: Obstetrics | Admitting: Obstetrics

## 2020-09-05 DIAGNOSIS — N760 Acute vaginitis: Secondary | ICD-10-CM | POA: Diagnosis present

## 2020-09-05 DIAGNOSIS — B9689 Other specified bacterial agents as the cause of diseases classified elsewhere: Secondary | ICD-10-CM | POA: Diagnosis present

## 2020-09-05 NOTE — Addendum Note (Signed)
Addended by: Courtney Heys on: 09/05/2020 10:28 AM   Modules accepted: Orders

## 2020-09-06 LAB — CERVICOVAGINAL ANCILLARY ONLY
Bacterial Vaginitis (gardnerella): POSITIVE — AB
Candida Glabrata: NEGATIVE
Candida Vaginitis: NEGATIVE
Chlamydia: NEGATIVE
Comment: NEGATIVE
Comment: NEGATIVE
Comment: NEGATIVE
Comment: NEGATIVE
Comment: NEGATIVE
Comment: NORMAL
Neisseria Gonorrhea: NEGATIVE
Trichomonas: NEGATIVE

## 2020-09-07 ENCOUNTER — Other Ambulatory Visit: Payer: Self-pay | Admitting: Obstetrics

## 2020-09-08 DIAGNOSIS — R030 Elevated blood-pressure reading, without diagnosis of hypertension: Secondary | ICD-10-CM | POA: Insufficient documentation

## 2020-09-13 ENCOUNTER — Other Ambulatory Visit: Payer: Self-pay

## 2020-09-13 ENCOUNTER — Emergency Department (HOSPITAL_COMMUNITY)
Admission: EM | Admit: 2020-09-13 | Discharge: 2020-09-14 | Disposition: A | Discharge status: Home / Self Care | Payer: BC Managed Care – PPO | Attending: Emergency Medicine | Admitting: Emergency Medicine

## 2020-09-13 DIAGNOSIS — T7840XA Allergy, unspecified, initial encounter: Secondary | ICD-10-CM | POA: Diagnosis not present

## 2020-09-13 DIAGNOSIS — J029 Acute pharyngitis, unspecified: Secondary | ICD-10-CM | POA: Diagnosis not present

## 2020-09-13 DIAGNOSIS — R0981 Nasal congestion: Secondary | ICD-10-CM | POA: Diagnosis not present

## 2020-09-13 DIAGNOSIS — Z20822 Contact with and (suspected) exposure to covid-19: Secondary | ICD-10-CM | POA: Insufficient documentation

## 2020-09-13 DIAGNOSIS — R5081 Fever presenting with conditions classified elsewhere: Secondary | ICD-10-CM

## 2020-09-13 DIAGNOSIS — N3001 Acute cystitis with hematuria: Secondary | ICD-10-CM | POA: Diagnosis not present

## 2020-09-13 DIAGNOSIS — R519 Headache, unspecified: Secondary | ICD-10-CM | POA: Insufficient documentation

## 2020-09-13 DIAGNOSIS — R000 Tachycardia, unspecified: Secondary | ICD-10-CM | POA: Diagnosis not present

## 2020-09-13 DIAGNOSIS — R059 Cough, unspecified: Secondary | ICD-10-CM | POA: Insufficient documentation

## 2020-09-13 MED ORDER — DIPHENHYDRAMINE HCL 25 MG PO CAPS
50.0000 mg | ORAL_CAPSULE | Freq: Once | ORAL | Status: AC
  Administered 2020-09-13: 50 mg via ORAL
  Filled 2020-09-13: qty 2

## 2020-09-13 NOTE — ED Triage Notes (Signed)
Pt states that she took her BP med and abx medication as well as Nyquil and now feels like she is having a allergic reaction. C/o itching and skin feeling tingly. Pt skin is reddened from reaction.

## 2020-09-14 ENCOUNTER — Emergency Department (HOSPITAL_COMMUNITY): Payer: BC Managed Care – PPO

## 2020-09-14 LAB — URINALYSIS, ROUTINE W REFLEX MICROSCOPIC
Bilirubin Urine: NEGATIVE
Glucose, UA: NEGATIVE mg/dL
Ketones, ur: NEGATIVE mg/dL
Nitrite: NEGATIVE
Protein, ur: 100 mg/dL — AB
RBC / HPF: >50 RBC/hpf — ABNORMAL HIGH (ref 0–5)
Specific Gravity, Urine: 1.016 (ref 1.005–1.030)
WBC, UA: >50 WBC/hpf — ABNORMAL HIGH (ref 0–5)
pH: 6 (ref 5.0–8.0)

## 2020-09-14 LAB — CBC WITH DIFFERENTIAL/PLATELET
Abs Immature Granulocytes: 0.03 10*3/uL (ref 0.00–0.07)
Basophils Absolute: 0 10*3/uL (ref 0.0–0.1)
Basophils Relative: 0 %
Eosinophils Absolute: 0.1 10*3/uL (ref 0.0–0.5)
Eosinophils Relative: 1 %
HCT: 38.1 % (ref 36.0–46.0)
Hemoglobin: 12.3 g/dL (ref 12.0–15.0)
Immature Granulocytes: 0 %
Lymphocytes Relative: 4 %
Lymphs Abs: 0.4 10*3/uL — ABNORMAL LOW (ref 0.7–4.0)
MCH: 29.1 pg (ref 26.0–34.0)
MCHC: 32.3 g/dL (ref 30.0–36.0)
MCV: 90.3 fL (ref 80.0–100.0)
Monocytes Absolute: 0.3 10*3/uL (ref 0.1–1.0)
Monocytes Relative: 3 %
Neutro Abs: 8.6 10*3/uL — ABNORMAL HIGH (ref 1.7–7.7)
Neutrophils Relative %: 92 %
Platelets: 225 10*3/uL (ref 150–400)
RBC: 4.22 MIL/uL (ref 3.87–5.11)
RDW: 12.9 % (ref 11.5–15.5)
WBC: 9.3 10*3/uL (ref 4.0–10.5)
nRBC: 0 % (ref 0.0–0.2)

## 2020-09-14 LAB — RESP PANEL BY RT-PCR (FLU A&B, COVID) ARPGX2
Influenza A by PCR: NEGATIVE
Influenza B by PCR: NEGATIVE
SARS Coronavirus 2 by RT PCR: NEGATIVE

## 2020-09-14 LAB — BASIC METABOLIC PANEL
Anion gap: 7 (ref 5–15)
BUN: 13 mg/dL (ref 6–20)
CO2: 25 mmol/L (ref 22–32)
Calcium: 8.3 mg/dL — ABNORMAL LOW (ref 8.9–10.3)
Chloride: 102 mmol/L (ref 98–111)
Creatinine, Ser: 0.73 mg/dL (ref 0.44–1.00)
GFR, Estimated: >60 mL/min (ref >60)
Glucose, Bld: 124 mg/dL — ABNORMAL HIGH (ref 70–99)
Potassium: 3.7 mmol/L (ref 3.5–5.1)
Sodium: 134 mmol/L — ABNORMAL LOW (ref 135–145)

## 2020-09-14 LAB — POC SARS CORONAVIRUS 2 AG -  ED: SARS Coronavirus 2 Ag: NEGATIVE

## 2020-09-14 LAB — I-STAT BETA HCG BLOOD, ED (MC, WL, AP ONLY): I-stat hCG, quantitative: <5 m[IU]/mL (ref <5)

## 2020-09-14 LAB — LACTIC ACID, PLASMA: Lactic Acid, Venous: 1.6 mmol/L (ref 0.5–1.9)

## 2020-09-14 LAB — SEDIMENTATION RATE: Sed Rate: 11 mm/hr (ref 0–22)

## 2020-09-14 LAB — C-REACTIVE PROTEIN: CRP: 0.8 mg/dL (ref <1.0)

## 2020-09-14 LAB — GROUP A STREP BY PCR: Group A Strep by PCR: NOT DETECTED

## 2020-09-14 MED ORDER — ACETAMINOPHEN 325 MG PO TABS
650.0000 mg | ORAL_TABLET | Freq: Once | ORAL | Status: AC
  Administered 2020-09-14: 650 mg via ORAL
  Filled 2020-09-14: qty 2

## 2020-09-14 MED ORDER — SODIUM CHLORIDE 0.9 % IV BOLUS (SEPSIS)
1000.0000 mL | Freq: Once | INTRAVENOUS | Status: AC
  Administered 2020-09-14: 1000 mL via INTRAVENOUS

## 2020-09-14 MED ORDER — SODIUM CHLORIDE 0.9 % IV SOLN
1000.0000 mL | INTRAVENOUS | Status: DC
  Administered 2020-09-14: 1000 mL via INTRAVENOUS

## 2020-09-14 MED ORDER — DIPHENHYDRAMINE HCL 25 MG PO TABS: 25.0000 mg | ORAL_TABLET | Freq: Four times a day (QID) | ORAL | 0 refills | Status: DC

## 2020-09-14 MED ORDER — EPINEPHRINE 0.3 MG/0.3ML IJ SOAJ: 0.3000 mg | INTRAMUSCULAR | 0 refills | Status: DC | PRN

## 2020-09-14 MED ORDER — METHYLPREDNISOLONE SODIUM SUCC 125 MG IJ SOLR
125.0000 mg | Freq: Once | INTRAMUSCULAR | Status: AC
  Administered 2020-09-14: 125 mg via INTRAVENOUS
  Filled 2020-09-14: qty 2

## 2020-09-14 MED ORDER — SODIUM CHLORIDE 0.9 % IV SOLN
1.0000 g | Freq: Once | INTRAVENOUS | Status: AC
  Administered 2020-09-14: 1 g via INTRAVENOUS
  Filled 2020-09-14: qty 10

## 2020-09-14 MED ORDER — FAMOTIDINE 20 MG PO TABS: 20.0000 mg | ORAL_TABLET | Freq: Every day | ORAL | 0 refills | Status: DC

## 2020-09-14 MED ORDER — CEPHALEXIN 500 MG PO CAPS: 500.0000 mg | ORAL_CAPSULE | Freq: Three times a day (TID) | ORAL | 0 refills | Status: AC

## 2020-09-14 MED ORDER — DIPHENHYDRAMINE HCL 50 MG/ML IJ SOLN
25.0000 mg | Freq: Once | INTRAMUSCULAR | Status: AC
  Administered 2020-09-14: 25 mg via INTRAVENOUS
  Filled 2020-09-14: qty 1

## 2020-09-14 MED ORDER — FAMOTIDINE IN NACL 20-0.9 MG/50ML-% IV SOLN
20.0000 mg | Freq: Once | INTRAVENOUS | Status: AC
  Administered 2020-09-14: 20 mg via INTRAVENOUS
  Filled 2020-09-14: qty 50

## 2020-09-14 MED ORDER — ONDANSETRON 4 MG PO TBDP: 4.0000 mg | ORAL_TABLET | Freq: Three times a day (TID) | ORAL | 0 refills | Status: AC | PRN

## 2020-09-14 NOTE — ED Notes (Addendum)
Pt states that she is feeling worse and is crying. Pt c/o new onset CP and continued itching. States that it feels like the Benadryl is "just sitting in my chest". Denies SOB, but c/o cough as well x2 days.

## 2020-09-14 NOTE — ED Notes (Signed)
No rash, no shortness of breath,. states she feels better, MD to d/c pt

## 2020-09-14 NOTE — ED Provider Notes (Signed)
[facility] EMERGENCY DEPARTMENT Provider Note  CSN: [id number] Arrival date & time: 09/13/20 2149  Chief Complaint(s) Allergic Reaction  HPI Julia Mayer is a 30 y.o. female with a past medical history listed below including recent Bartholin cyst treated with Bactrim, bacterial vaginosis treated with tinidazole who presents to the emergency department with allergic reaction likely to Bactrim.  She reports that she did not take her Bactrim as she was supposed to only took a few days of her medicine.  Reports that she decided to take the medication tonight.  1 hour after taking the medication she developed hives.  Additionally patient reports that she has been having 2 days of cough and sore throat with nasal congestion and sinus pressure.  No abdominal pain.  No nausea or vomiting.  No urinary symptoms.  No vaginal discharge.  While in the waiting room patient received 50 mg of Benadryl which improved her itching and hives.  While in triage patient noted to be febrile. HPI  Past Medical History Past Medical History:  Diagnosis Date  . Chlamydia   . Gallstones 06/19/2018  . No pertinent past medical history   . Normal pregnancy 07/19/2011  . SVD (spontaneous vaginal delivery) 07/20/2011  . Urinary tract infection during pregnancy 06/15/2011  . UTI (urinary tract infection)    Patient Active Problem List   Diagnosis Date Noted  . Gallstones 06/19/2018  . Abnormal CT of the abdomen   . Candida vaginitis 01/28/2011   Home Medication(s) Prior to Admission medications   Medication Sig Start Date End Date Taking? Authorizing Provider  amLODipine (NORVASC) 5 MG tablet Take by mouth. 09/08/20  Yes [provider]  cephALEXin (KEFLEX) 500 MG capsule Take 1 capsule (500 mg total) by mouth 3 (three) times daily for 7 days. 09/14/20 09/21/20 Yes Leeza Heiner, Grayce Sessions, MD  diphenhydrAMINE (BENADRYL) 25 MG tablet Take 1 tablet (25 mg total) by mouth every 6 (six) hours for  5 days. 09/14/20 09/19/20 Yes Kazoua Gossen, Grayce Sessions, MD  EPINEPHrine 0.3 mg/0.3 mL IJ SOAJ injection Inject 0.3 mg into the muscle as needed for anaphylaxis. 09/14/20  Yes Devesh Monforte, Grayce Sessions, MD  famotidine (PEPCID) 20 MG tablet Take 1 tablet (20 mg total) by mouth daily for 5 days. 09/14/20 09/19/20 Yes Waylin Dorko, Grayce Sessions, MD  ondansetron (ZOFRAN ODT) 4 MG disintegrating tablet Take 1 tablet (4 mg total) by mouth every 8 (eight) hours as needed for up to 3 days for nausea or vomiting. 09/14/20 09/17/20 Yes Jaleea Alesi, Grayce Sessions, MD  dicyclomine (BENTYL) 20 MG tablet Take 1 tablet (20 mg total) by mouth 2 (two) times daily. 04/26/19   Carlisle Cater, PA-C  famotidine (PEPCID) 20 MG tablet Take 1 tablet (20 mg total) by mouth 2 (two) times daily. 04/26/19   Carlisle Cater, PA-C  ibuprofen (ADVIL) 800 MG tablet Take 1 tablet (800 mg total) by mouth every 8 (eight) hours as needed. 09/04/20   Shelly Bombard, MD  tinidazole (TINDAMAX) 500 MG tablet Take 2 tablets (1,000 mg total) by mouth daily with breakfast. 09/04/20   Shelly Bombard, MD  Past Surgical History Past Surgical History:  Procedure Laterality Date  . CHOLECYSTECTOMY N/A 06/19/2018   Procedure: LAPAROSCOPIC CHOLECYSTECTOMY WITH INTRAOPERATIVE CHOLANGIOGRAM ERAS PATHWAY;  Surgeon: Fanny Skates, MD;  Location: [facility];  Service: General;  Laterality: N/A;  . ESOPHAGOGASTRODUODENOSCOPY N/A 06/11/2018   Procedure: ESOPHAGOGASTRODUODENOSCOPY (EGD);  Surgeon: Milus Banister, MD;  Location: [facility];  Service: Endoscopy;  Laterality: N/A;  . EUS N/A 06/11/2018   Procedure: UPPER ENDOSCOPIC ULTRASOUND (EUS) RADIAL;  Surgeon: Milus Banister, MD;  Location: [facility];  Service: Endoscopy;  Laterality: N/A;  . 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   Marland Kitchen FINE NEEDLE ASPIRATION N/A 06/11/2018   Procedure:  FINE NEEDLE ASPIRATION (FNA) LINEAR;  Surgeon: Milus Banister, MD;  Location: [facility];  Service: Endoscopy;  Laterality: N/A;  . INDUCED ABORTION     Family History Family History  Problem Relation Age of Onset  . Hypertension Mother   . Asthma Mother   . Alcohol abuse Mother   . Hypertension Father   . Hyperlipidemia Father   . Breast cancer Maternal Grandmother   . Cancer Paternal Grandmother   . Anesthesia problems Paternal Grandmother     Social History Social History   Tobacco Use  . Smoking status: Never Smoker  . Smokeless tobacco: Never Used  Vaping Use  . Vaping Use: Never used  Substance Use Topics  . Alcohol use: No    Comment: occasionally  . Drug use: No   Allergies Bactrim [sulfamethoxazole-trimethoprim]  Review of Systems Review of Systems All other systems are reviewed and are negative for acute change except as noted in the HPI  Physical Exam Vital Signs  I have reviewed the triage vital signs BP 120/86   Pulse 92   Temp 99.1 F (37.3 C) (Oral)   Resp 18   Ht 5\' 2"  (1.575 m)   Wt 88.9 kg   LMP 09/14/2020   SpO2 98%   BMI 35.85 kg/m   Physical Exam Vitals reviewed.  Constitutional:      General: She is not in acute distress.    Appearance: She is well-developed. She is not diaphoretic.  HENT:     Head: Normocephalic and atraumatic.     Nose: Nose normal.     Mouth/Throat:     Mouth: No angioedema.  Eyes:     General: No scleral icterus.       Right eye: No discharge.        Left eye: No discharge.     Pupils: Pupils are equal, round, and reactive to light.  Cardiovascular:     Rate and Rhythm: Regular rhythm. Tachycardia present.     Heart sounds: No murmur heard. No friction rub. No gallop.   Pulmonary:     Effort: Pulmonary effort is normal. No respiratory distress.     Breath sounds: Normal breath sounds. No stridor. No rales.  Abdominal:     General: There is no distension.     Palpations: Abdomen is soft.      Tenderness: There is no abdominal tenderness.  Musculoskeletal:        General: No tenderness.     Cervical back: Normal range of motion and neck supple.  Skin:    General: Skin is warm and dry.     Findings: No erythema or rash. Rash is not urticarial.  Neurological:     Mental Status: She is alert and oriented to person, place, and time.  ED Results and Treatments Labs (all labs ordered are listed, but only abnormal results are displayed) Labs Reviewed  CBC WITH DIFFERENTIAL/PLATELET - Abnormal; Notable for the following components:      Result Value   Neutro Abs 8.6 (*)    Lymphs Abs 0.4 (*)    All other components within normal limits  BASIC METABOLIC PANEL - Abnormal; Notable for the following components:   Sodium 134 (*)    Glucose, Bld 124 (*)    Calcium 8.3 (*)    All other components within normal limits  URINALYSIS, ROUTINE W REFLEX MICROSCOPIC - Abnormal; Notable for the following components:   Color, Urine AMBER (*)    APPearance HAZY (*)    Hgb urine dipstick LARGE (*)    Protein, ur 100 (*)    Leukocytes,Ua MODERATE (*)    RBC / HPF >50 (*)    WBC, UA >50 (*)    Bacteria, UA RARE (*)    All other components within normal limits  GROUP A STREP BY PCR  RESP PANEL BY RT-PCR (FLU A&B, COVID) ARPGX2  SEDIMENTATION RATE  C-REACTIVE PROTEIN  LACTIC ACID, PLASMA  I-STAT BETA HCG BLOOD, ED ([facility], [facility], [facility] ONLY)  POC SARS CORONAVIRUS 2 AG -  ED                                                                                                                         EKG  EKG Interpretation  Date/Time:  Thursday September 14 2020 01:49:22 EST Ventricular Rate:  97 PR Interval:  156 QRS Duration: 68 QT Interval:  320 QTC Calculation: 406 R Axis:   58 Text Interpretation: Normal sinus rhythm with sinus arrhythmia Nonspecific T wave abnormality Abnormal ECG No old tracing to compare Confirmed by Addison Lank [phone]) on 09/14/2020 3:13:02 AM      Radiology DG Chest 2  View  Result Date: 09/14/2020 CLINICAL DATA:  Chest pain EXAM: CHEST - 2 VIEW COMPARISON:  None. FINDINGS: The heart size and mediastinal contours are within normal limits. Both lungs are clear. The visualized skeletal structures are unremarkable. IMPRESSION: No active cardiopulmonary disease. Electronically Signed   By: Ulyses Jarred M.D.   On: 09/14/2020 02:32    Pertinent labs & imaging results that were available during my care of the patient were reviewed by me and considered in my medical decision making (see chart for details).  Medications Ordered in ED Medications  sodium chloride 0.9 % bolus 1,000 mL (0 mLs Intravenous Stopped 09/14/20 0546)    Followed by  0.9 %  sodium chloride infusion (1,000 mLs Intravenous New Bag/Given 09/14/20 0452)  diphenhydrAMINE (BENADRYL) capsule 50 mg (50 mg Oral Given 09/13/20 2232)  acetaminophen (TYLENOL) tablet 650 mg (650 mg Oral Given 09/14/20 0158)  famotidine (PEPCID) IVPB 20 mg premix (0 mg Intravenous Stopped 09/14/20 0546)  methylPREDNISolone sodium succinate (SOLU-MEDROL) 125 mg/2 mL injection 125 mg (125 mg Intravenous Given 09/14/20 0454)  diphenhydrAMINE (BENADRYL) injection 25 mg (  25 mg Intravenous Given 09/14/20 0454)  cefTRIAXone (ROCEPHIN) 1 g in sodium chloride 0.9 % 100 mL IVPB (0 g Intravenous Stopped 09/14/20 0706)                                                                                                                                    Procedures Procedures  (including critical care time)  Medical Decision Making / ED Course I have reviewed the nursing notes for this encounter and the patient's prior records (if available in EHR or on provided paperwork).   DEOLA REWIS was evaluated in Emergency Department on 09/14/2020 for the symptoms described in the history of present illness. She was evaluated in the context of the global COVID-19 pandemic, which necessitated consideration that the patient might be at risk for infection  with the SARS-CoV-2 virus that causes COVID-19. Institutional protocols and algorithms that pertain to the evaluation of patients at risk for COVID-19 are in a state of rapid change based on information released by regulatory bodies including the CDC and federal and state organizations. These policies and algorithms were followed during the patient's care in the ED.  Patient presents with hives. Likely Bactrim.  Additionally noted to have a fever in triage.  Patient reports that she has been having URI symptoms for 2 days with cough, nasal congestion, sore throat, and headache. No nausea or vomiting. No chest pain or shortness of breath.  Patient was given Benadryl in triage and hives have resolved. She was given steroids and Pepcid. No need for epi. Monitor for several hours without recurrence.  On exam there is no evidence of pharyngitis, acute otitis media.  No clinical findings concerning for meningitis.  Chest x-ray without evidence of pneumonia.  UA suspicious for urinary tract infection. Covid and influenza negative.  Rapid strep negative. Labs without leukocytosis. No significant electrolyte derangements or renal sufficiency.  Fever is likely infectious from viral URI vs UTI. No evidence concerning for SJS or TEN.  Bactrim added to allergy list.       Final Clinical Impression(s) / ED Diagnoses Final diagnoses:  Allergic reaction, initial encounter  Fever in other diseases  Acute cystitis with hematuria   The patient appears reasonably screened and/or stabilized for discharge and I doubt any other medical condition or other [facility] requiring further screening, evaluation, or treatment in the ED at this time prior to discharge. Safe for discharge with strict return precautions.  Disposition: Discharge  Condition: Good  I have discussed the results, Dx and Tx plan with the patient/family who expressed understanding and agree(s) with the plan. Discharge instructions discussed  at length. The patient/family was given strict return precautions who verbalized understanding of the instructions. No further questions at time of discharge.    ED Discharge Orders         Ordered    EPINEPHrine 0.3 mg/0.3 mL IJ SOAJ injection  As needed  09/14/20 0756    famotidine (PEPCID) 20 MG tablet  Daily        09/14/20 0756    diphenhydrAMINE (BENADRYL) 25 MG tablet  Every 6 hours        09/14/20 0756    ondansetron (ZOFRAN ODT) 4 MG disintegrating tablet  Every 8 hours PRN        09/14/20 0756    cephALEXin (KEFLEX) 500 MG capsule  3 times daily        09/14/20 5374           Follow Up: Katherina Mires, MD [address] [place] [address] [phone]  Call on 09/18/2020 to schedule an appointment for close follow up      This chart was dictated using voice recognition software.  Despite best efforts to proofread,  errors can occur which can change the documentation meaning.   Fatima Blank, MD 09/14/20 0800

## 2020-09-14 NOTE — ED Notes (Signed)
ED Provider at bedside. 

## 2020-09-14 NOTE — ED Notes (Signed)
Up to bathroom

## 2020-10-02 ENCOUNTER — Ambulatory Visit: Payer: BC Managed Care – PPO | Admitting: Obstetrics

## 2020-10-03 ENCOUNTER — Ambulatory Visit: Payer: BC Managed Care – PPO | Admitting: Obstetrics

## 2020-10-12 ENCOUNTER — Encounter: Payer: Self-pay | Admitting: Podiatry

## 2020-10-12 ENCOUNTER — Ambulatory Visit (INDEPENDENT_AMBULATORY_CARE_PROVIDER_SITE_OTHER): Payer: BC Managed Care – PPO | Admitting: Podiatry

## 2020-10-12 ENCOUNTER — Other Ambulatory Visit: Payer: Self-pay

## 2020-10-12 DIAGNOSIS — L603 Nail dystrophy: Secondary | ICD-10-CM | POA: Diagnosis not present

## 2020-10-14 NOTE — Progress Notes (Signed)
Subjective:  Patient ID: Julia Mayer, female    DOB: [birth year],  MRN: [id number] HPI Chief Complaint  Patient presents with  . Nail Problem    Toenails - check nails, some of them are starting to look dark  . Skin Problem    2nd toe left - patient initially made an appointment for a darkened spot on her toe, but she went to the dermatologist yesterday and they removed it, thought it could be a mole  . New Patient (Initial Visit)    30 y.o. female presents with the above complaint.   ROS: Denies fever chills nausea vomiting muscle aches pains calf pain back pain chest pain shortness of breath.  Past Medical History:  Diagnosis Date  . Chlamydia   . Gallstones 06/19/2018  . No pertinent past medical history   . Normal pregnancy 07/19/2011  . SVD (spontaneous vaginal delivery) 07/20/2011  . Urinary tract infection during pregnancy 06/15/2011  . UTI (urinary tract infection)    Past Surgical History:  Procedure Laterality Date  . CHOLECYSTECTOMY N/A 06/19/2018   Procedure: LAPAROSCOPIC CHOLECYSTECTOMY WITH INTRAOPERATIVE CHOLANGIOGRAM ERAS PATHWAY;  Surgeon: Fanny Skates, MD;  Location: [facility];  Service: General;  Laterality: N/A;  . ESOPHAGOGASTRODUODENOSCOPY N/A 06/11/2018   Procedure: ESOPHAGOGASTRODUODENOSCOPY (EGD);  Surgeon: Milus Banister, MD;  Location: [facility];  Service: Endoscopy;  Laterality: N/A;  . EUS N/A 06/11/2018   Procedure: UPPER ENDOSCOPIC ULTRASOUND (EUS) RADIAL;  Surgeon: Milus Banister, MD;  Location: [facility];  Service: Endoscopy;  Laterality: N/A;  . 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   Marland Kitchen FINE NEEDLE ASPIRATION N/A 06/11/2018   Procedure: FINE NEEDLE ASPIRATION (FNA) LINEAR;  Surgeon: Milus Banister, MD;  Location: [facility];  Service: Endoscopy;  Laterality: N/A;  . INDUCED ABORTION      Current Outpatient Medications:  .  amLODipine (NORVASC) 5 MG tablet, Take by mouth., Disp: , Rfl:   Allergies  Allergen Reactions  . Bactrim  [Sulfamethoxazole-Trimethoprim] Hives   Review of Systems Objective:  There were no vitals filed for this visit.  General: Well developed, nourished, in no acute distress, alert and oriented x3   Dermatological: Skin is warm, dry and supple bilateral. Nails x 10 are well maintained; remaining integument appears unremarkable at this time. There are no open sores, no preulcerative lesions, no rash or signs of infection present.  She had a mole on the distal aspect of the second toe that the dermatologist removed yesterday she was going to come see [place] for this but decided she would have the dermatologist to go ahead and remove it so she came today concerned about the discoloration of the toenails.  Which only demonstrates Malan onychia throughout the majority of her nails including her fingernails.   Vascular: Dorsalis Pedis artery and Posterior Tibial artery pedal pulses are 2/4 bilateral with immedate capillary fill time. Pedal hair growth present. No varicosities and no lower extremity edema present bilateral.   Neruologic: Grossly intact via light touch bilateral. Vibratory intact via tuning fork bilateral. Protective threshold with Semmes Wienstein monofilament intact to all pedal sites bilateral. Patellar and Achilles deep tendon reflexes 2+ bilateral. No Babinski or clonus noted bilateral.   Musculoskeletal: No gross boney pedal deformities bilateral. No pain, crepitus, or limitation noted with foot and ankle range of motion bilateral. Muscular strength 5/5 in all groups tested bilateral.  Gait: Unassisted, Nonantalgic.    Radiographs:  None taken  Assessment & Plan:  Assessment: Malan onychia.  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  Plan: Follow-up as needed.  She will follow-up with [place] with any concerns or new discolorations of the nails or pigmented areas.     Dquan Cortopassi T. Blanche, [place]

## 2020-10-23 ENCOUNTER — Encounter (HOSPITAL_BASED_OUTPATIENT_CLINIC_OR_DEPARTMENT_OTHER): Payer: Self-pay | Admitting: *Deleted

## 2020-10-23 ENCOUNTER — Emergency Department (HOSPITAL_BASED_OUTPATIENT_CLINIC_OR_DEPARTMENT_OTHER): Payer: BC Managed Care – PPO

## 2020-10-23 ENCOUNTER — Emergency Department (HOSPITAL_BASED_OUTPATIENT_CLINIC_OR_DEPARTMENT_OTHER)
Admission: EM | Admit: 2020-10-23 | Discharge: 2020-10-24 | Disposition: A | Discharge status: Home / Self Care | Payer: BC Managed Care – PPO | Attending: Emergency Medicine | Admitting: Emergency Medicine

## 2020-10-23 ENCOUNTER — Other Ambulatory Visit: Payer: Self-pay

## 2020-10-23 DIAGNOSIS — N898 Other specified noninflammatory disorders of vagina: Secondary | ICD-10-CM | POA: Insufficient documentation

## 2020-10-23 DIAGNOSIS — D72829 Elevated white blood cell count, unspecified: Secondary | ICD-10-CM | POA: Insufficient documentation

## 2020-10-23 DIAGNOSIS — R110 Nausea: Secondary | ICD-10-CM | POA: Diagnosis not present

## 2020-10-23 DIAGNOSIS — R1084 Generalized abdominal pain: Secondary | ICD-10-CM

## 2020-10-23 DIAGNOSIS — R1032 Left lower quadrant pain: Secondary | ICD-10-CM | POA: Diagnosis not present

## 2020-10-23 DIAGNOSIS — R109 Unspecified abdominal pain: Secondary | ICD-10-CM | POA: Diagnosis present

## 2020-10-23 LAB — URINALYSIS, ROUTINE W REFLEX MICROSCOPIC
Bilirubin Urine: NEGATIVE
Glucose, UA: NEGATIVE mg/dL
Hgb urine dipstick: NEGATIVE
Ketones, ur: NEGATIVE mg/dL
Leukocytes,Ua: NEGATIVE
Nitrite: NEGATIVE
Protein, ur: NEGATIVE mg/dL
Specific Gravity, Urine: 1.02 (ref 1.005–1.030)
pH: 6 (ref 5.0–8.0)

## 2020-10-23 LAB — CBC WITH DIFFERENTIAL/PLATELET
Abs Immature Granulocytes: 0.07 10*3/uL (ref 0.00–0.07)
Basophils Absolute: 0 10*3/uL (ref 0.0–0.1)
Basophils Relative: 0 %
Eosinophils Absolute: 0.1 10*3/uL (ref 0.0–0.5)
Eosinophils Relative: 0 %
HCT: 37.7 % (ref 36.0–46.0)
Hemoglobin: 12.2 g/dL (ref 12.0–15.0)
Immature Granulocytes: 0 %
Lymphocytes Relative: 10 %
Lymphs Abs: 1.6 10*3/uL (ref 0.7–4.0)
MCH: 28.9 pg (ref 26.0–34.0)
MCHC: 32.4 g/dL (ref 30.0–36.0)
MCV: 89.3 fL (ref 80.0–100.0)
Monocytes Absolute: 0.8 10*3/uL (ref 0.1–1.0)
Monocytes Relative: 5 %
Neutro Abs: 13.7 10*3/uL — ABNORMAL HIGH (ref 1.7–7.7)
Neutrophils Relative %: 85 %
Platelets: 302 10*3/uL (ref 150–400)
RBC: 4.22 MIL/uL (ref 3.87–5.11)
RDW: 13.2 % (ref 11.5–15.5)
WBC: 16.2 10*3/uL — ABNORMAL HIGH (ref 4.0–10.5)
nRBC: 0 % (ref 0.0–0.2)

## 2020-10-23 LAB — WET PREP, GENITAL
Clue Cells Wet Prep HPF POC: NONE SEEN
Sperm: NONE SEEN
Trich, Wet Prep: NONE SEEN
Yeast Wet Prep HPF POC: NONE SEEN

## 2020-10-23 LAB — COMPREHENSIVE METABOLIC PANEL
ALT: 22 U/L (ref 0–44)
AST: 15 U/L (ref 15–41)
Albumin: 3.6 g/dL (ref 3.5–5.0)
Alkaline Phosphatase: 64 U/L (ref 38–126)
Anion gap: 9 (ref 5–15)
BUN: 15 mg/dL (ref 6–20)
CO2: 24 mmol/L (ref 22–32)
Calcium: 8.8 mg/dL — ABNORMAL LOW (ref 8.9–10.3)
Chloride: 100 mmol/L (ref 98–111)
Creatinine, Ser: 0.56 mg/dL (ref 0.44–1.00)
GFR, Estimated: >60 mL/min (ref >60)
Glucose, Bld: 99 mg/dL (ref 70–99)
Potassium: 3.4 mmol/L — ABNORMAL LOW (ref 3.5–5.1)
Sodium: 133 mmol/L — ABNORMAL LOW (ref 135–145)
Total Bilirubin: 0.5 mg/dL (ref 0.3–1.2)
Total Protein: 7.1 g/dL (ref 6.5–8.1)

## 2020-10-23 LAB — PREGNANCY, URINE: Preg Test, Ur: NEGATIVE

## 2020-10-23 LAB — LIPASE, BLOOD: Lipase: 22 U/L (ref 11–51)

## 2020-10-23 MED ORDER — IOHEXOL 300 MG/ML  SOLN
100.0000 mL | Freq: Once | INTRAMUSCULAR | Status: AC | PRN
  Administered 2020-10-23: 100 mL via INTRAVENOUS

## 2020-10-23 MED ORDER — MORPHINE SULFATE (PF) 4 MG/ML IV SOLN
4.0000 mg | Freq: Once | INTRAVENOUS | Status: AC
  Administered 2020-10-23: 4 mg via INTRAVENOUS
  Filled 2020-10-23: qty 1

## 2020-10-23 MED ORDER — ACETAMINOPHEN 325 MG PO TABS
650.0000 mg | ORAL_TABLET | Freq: Once | ORAL | Status: AC
  Administered 2020-10-23: 650 mg via ORAL
  Filled 2020-10-23: qty 2

## 2020-10-23 MED ORDER — ONDANSETRON HCL 4 MG/2ML IJ SOLN
4.0000 mg | Freq: Once | INTRAMUSCULAR | Status: AC
  Administered 2020-10-23: 4 mg via INTRAVENOUS
  Filled 2020-10-23: qty 2

## 2020-10-23 NOTE — ED Notes (Signed)
Assumed care of the patient. Vitals taken. Respirations regular/unlabored. NAD. PIV obtained. Labs collected/labeled and sent to lab. Medicated per MAR. Connected to BP and pulse ox. Stretcher low, wheels locked, call bell within reach.

## 2020-10-23 NOTE — ED Triage Notes (Signed)
Abdominal cramping. No diarrhea.

## 2020-10-23 NOTE — ED Provider Notes (Signed)
Julia Mayer   CSN: [id number] Arrival date & time: 10/23/20  2058     History No chief complaint on file.   Julia Mayer is a 30 y.o. female.  Patient is a 30 year old female with a history of prior UTI, gallstones who is presenting today with complaints of abdominal pain and pressure that started yesterday but became more severe today.  She describes it as a pulling tearing sensation in her lower and mid abdomen.  It is worse with movement.  She has had nausea but no vomiting.  Currently the pain is a 10 out of 10.  She has not noticed any change in urination.  She had some minimal vaginal spotting yesterday but has not noticed any today and denies any vaginal discharge.  She is sexually active with only one partner and has been without partner for years.  She also noted a fever today but has not felt feverish prior to that.  She denies any cough, congestion or chest pain.  No prior history of ovarian cysts.  Prior abdominal surgeries are cholecystectomy.  The history is provided by the patient.       Past Medical History:  Diagnosis Date  . Chlamydia   . Gallstones 06/19/2018  . No pertinent past medical history   . Normal pregnancy 07/19/2011  . SVD (spontaneous vaginal delivery) 07/20/2011  . Urinary tract infection during pregnancy 06/15/2011  . UTI (urinary tract infection)     Patient Active Problem List   Diagnosis Date Noted  . Elevated blood-pressure reading without diagnosis of hypertension 09/08/2020  . Abnormal Pap smear of cervix 05/19/2019  . Obesity (BMI 30-39.9) 05/19/2019  . Gallstones 06/19/2018  . Abnormal CT of the abdomen   . Candida vaginitis 01/28/2011    Past Surgical History:  Procedure Laterality Date  . CHOLECYSTECTOMY N/A 06/19/2018   Procedure: LAPAROSCOPIC CHOLECYSTECTOMY WITH INTRAOPERATIVE CHOLANGIOGRAM ERAS PATHWAY;  Surgeon: Fanny Skates, MD;  Location: [place];  Service: General;  Laterality:  N/A;  . ESOPHAGOGASTRODUODENOSCOPY N/A 06/11/2018   Procedure: ESOPHAGOGASTRODUODENOSCOPY (EGD);  Surgeon: Milus Banister, MD;  Location: [facility];  Service: Endoscopy;  Laterality: N/A;  . EUS N/A 06/11/2018   Procedure: UPPER ENDOSCOPIC ULTRASOUND (EUS) RADIAL;  Surgeon: Milus Banister, MD;  Location: [facility];  Service: Endoscopy;  Laterality: N/A;  . 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   Marland Kitchen FINE NEEDLE ASPIRATION N/A 06/11/2018   Procedure: FINE NEEDLE ASPIRATION (FNA) LINEAR;  Surgeon: Milus Banister, MD;  Location: [facility];  Service: Endoscopy;  Laterality: N/A;  . INDUCED ABORTION       OB History    Gravida  5   Para  2   Term  2   Preterm  0   AB  3   Living  2     SAB  0   IAB  3   Ectopic  0   Multiple  0   Live Births  2           Family History  Problem Relation Age of Onset  . Hypertension Mother   . Asthma Mother   . Alcohol abuse Mother   . Hypertension Father   . Hyperlipidemia Father   . Breast cancer Maternal Grandmother   . Cancer Paternal Grandmother   . Anesthesia problems Paternal Grandmother     Social History   Tobacco Use  . Smoking status: Never Smoker  .  Smokeless tobacco: Never Used  Vaping Use  . Vaping Use: Never used  Substance Use Topics  . Alcohol use: No    Comment: occasionally  . Drug use: No    Home Medications Prior to Admission medications   Medication Sig Start Date End Date Taking? Authorizing Provider  amLODipine (NORVASC) 5 MG tablet Take by mouth. 09/08/20  Yes [provider]    Allergies    Bactrim [sulfamethoxazole-trimethoprim]  Review of Systems   Review of Systems  All other systems reviewed and are negative.   Physical Exam Updated Vital Signs BP (!) 132/92 (BP Location: Right Arm)   Pulse 76   Temp 99.3 F (37.4 C) (Oral)   Resp 19   Ht 5\' 2"  (1.575 m)   Wt 88.9 kg   LMP 10/11/2020   SpO2 100%   BMI 35.85 kg/m   Physical Exam Vitals and nursing  Mayer reviewed. Exam conducted with a chaperone present.  Constitutional:      General: She is not in acute distress.    Appearance: Normal appearance. She is well-developed.  HENT:     Head: Normocephalic and atraumatic.  Eyes:     Pupils: Pupils are equal, round, and reactive to light.  Cardiovascular:     Rate and Rhythm: Normal rate and regular rhythm.     Heart sounds: Normal heart sounds. No murmur heard. No friction rub.  Pulmonary:     Effort: Pulmonary effort is normal.     Breath sounds: Normal breath sounds. No wheezing or rales.  Abdominal:     General: Bowel sounds are normal. There is no distension.     Palpations: Abdomen is soft.     Tenderness: There is abdominal tenderness in the right lower quadrant, periumbilical area, suprapubic area and left lower quadrant. There is guarding. There is no right CVA tenderness, left CVA tenderness or rebound. Negative signs include psoas sign.  Genitourinary:    Exam position: Lithotomy position.     Labia:        Right: No tenderness.        Left: No tenderness.      Vagina: Vaginal discharge present.     Cervix: Normal.     Uterus: Normal.      Adnexa:        Right: Tenderness present.        Left: Tenderness present.      Comments: Minimal spotting noted in the vaginal canal. Mild tenderness with palpation of adnexa bilaterally Musculoskeletal:        General: No tenderness. Normal range of motion.     Comments: No edema  Skin:    General: Skin is warm and dry.     Findings: No rash.  Neurological:     Mental Status: She is alert and oriented to person, place, and time. Mental status is at baseline.     Cranial Nerves: No cranial nerve deficit.     Sensory: No sensory deficit.     Motor: No weakness.  Psychiatric:        Mood and Affect: Mood normal.        Behavior: Behavior normal.        Thought Content: Thought content normal.     ED Results / Procedures / Treatments   Labs (all labs ordered are listed, but  only abnormal results are displayed) Labs Reviewed  WET PREP, GENITAL - Abnormal; Notable for the following components:      Result Value  WBC, Wet Prep HPF POC MANY (*)    All other components within normal limits  CBC WITH DIFFERENTIAL/PLATELET - Abnormal; Notable for the following components:   WBC 16.2 (*)    Neutro Abs 13.7 (*)    All other components within normal limits  COMPREHENSIVE METABOLIC PANEL - Abnormal; Notable for the following components:   Sodium 133 (*)    Potassium 3.4 (*)    Calcium 8.8 (*)    All other components within normal limits  URINALYSIS, ROUTINE W REFLEX MICROSCOPIC  PREGNANCY, URINE  LIPASE, BLOOD  GC/CHLAMYDIA PROBE AMP ([place]) NOT AT [facility]    EKG None  Radiology No results found.  Procedures Procedures   Medications Ordered in ED Medications  acetaminophen (TYLENOL) tablet 650 mg (650 mg Oral Given 10/23/20 2122)  morphine 4 MG/ML injection 4 mg (4 mg Intravenous Given 10/23/20 2237)  ondansetron (ZOFRAN) injection 4 mg (4 mg Intravenous Given 10/23/20 2237)    ED Course  I have reviewed the triage vital signs and the nursing notes.  Pertinent labs & imaging results that were available during my care of the patient were reviewed by me and considered in my medical decision making (see chart for details).    MDM Rules/Calculators/A&P                          Pt is a 30y/o female presenting today with abdominal pain.  Sounds like pain started yesterday but is significantly worsened today.  Is more in her lower abdomen.  She denies any urinary symptoms but did Mayer some minimal vaginal spotting.  On exam patient has significant lower abdominal tenderness.  Fever noted upon arrival here that has improved with medications.  UA today without significant findings for infection, CBC with leukocytosis of 16,000, CMP without acute findings and wet prep with many white blood cells.  Given patient's location of pain, onset of pain she has some  mild bilateral adnexal tenderness but no symptoms consistent with PID.  Lower suspicion for TOA, ovarian torsion.  Concern for possible acute bowel issues such as appendicitis, colitis however patient is not having any vomiting or diarrhea.  Patient's lipase is within normal limits.  She is having some back pain but no findings consistent with UTI or pyelonephritis.  We will do CT to further evaluate.  Patient given pain medication with some improvement. Final Clinical Impression(s) / ED Diagnoses Final diagnoses:  None    Rx / DC Orders ED Discharge Orders    None       Blanchie Dessert, MD 10/23/20 2335

## 2020-10-24 MED ORDER — MORPHINE SULFATE (PF) 4 MG/ML IV SOLN
4.0000 mg | Freq: Once | INTRAVENOUS | Status: AC
  Administered 2020-10-24: 4 mg via INTRAVENOUS
  Filled 2020-10-24: qty 1

## 2020-10-24 MED ORDER — ONDANSETRON 8 MG PO TBDP: 8.0000 mg | ORAL_TABLET | Freq: Three times a day (TID) | ORAL | 0 refills | Status: DC | PRN

## 2020-10-24 MED ORDER — OXYCODONE-ACETAMINOPHEN 5-325 MG PO TABS: 2.0000 | ORAL_TABLET | ORAL | 0 refills | Status: DC | PRN

## 2020-10-24 MED ORDER — OXYCODONE-ACETAMINOPHEN 5-325 MG PO TABS: 1.0000 | ORAL_TABLET | Freq: Four times a day (QID) | ORAL | 0 refills | Status: DC | PRN

## 2020-10-24 MED ORDER — OXYCODONE-ACETAMINOPHEN 5-325 MG PO TABS: 1.0000 | ORAL_TABLET | Freq: Four times a day (QID) | ORAL | 0 refills | Status: AC | PRN

## 2020-10-24 MED ORDER — ONDANSETRON 8 MG PO TBDP: 8.0000 mg | ORAL_TABLET | Freq: Three times a day (TID) | ORAL | 0 refills | Status: AC | PRN

## 2020-10-24 NOTE — ED Notes (Signed)
Patient educated on discharge paperwork. Pt verbalizes understanding. Vitals taken. A&OX4. Respirations regular/unlabored. Patient's boyfriend at bedside. PIV removed. NAD. Ambulatory with steady gait.

## 2020-10-24 NOTE — ED Provider Notes (Signed)
Nursing notes and vitals signs, including pulse oximetry, reviewed.  Summary of this visit's results, reviewed by myself:  EKG:  EKG Interpretation  Date/Time:    Ventricular Rate:    PR Interval:    QRS Duration:   QT Interval:    QTC Calculation:   R Axis:     Text Interpretation:         Labs:  Results for orders placed or performed during the hospital encounter of 10/23/20 (from the past 24 hour(s))  Urinalysis, Routine w reflex microscopic Urine, Clean Catch     Status: None   Collection Time: 10/23/20  9:17 PM  Result Value Ref Range   Color, Urine YELLOW YELLOW   APPearance CLEAR CLEAR   Specific Gravity, Urine 1.020 1.005 - 1.030   pH 6.0 5.0 - 8.0   Glucose, UA NEGATIVE NEGATIVE mg/dL   Hgb urine dipstick NEGATIVE NEGATIVE   Bilirubin Urine NEGATIVE NEGATIVE   Ketones, ur NEGATIVE NEGATIVE mg/dL   Protein, ur NEGATIVE NEGATIVE mg/dL   Nitrite NEGATIVE NEGATIVE   Leukocytes,Ua NEGATIVE NEGATIVE  Pregnancy, urine     Status: None   Collection Time: 10/23/20  9:17 PM  Result Value Ref Range   Preg Test, Ur NEGATIVE NEGATIVE  CBC with Differential/Platelet     Status: Abnormal   Collection Time: 10/23/20 10:23 PM  Result Value Ref Range   WBC 16.2 (H) 4.0 - 10.5 K/uL   RBC 4.22 3.87 - 5.11 MIL/uL   Hemoglobin 12.2 12.0 - 15.0 g/dL   HCT 37.7 36.0 - 46.0 %   MCV 89.3 80.0 - 100.0 fL   MCH 28.9 26.0 - 34.0 pg   MCHC 32.4 30.0 - 36.0 g/dL   RDW 13.2 11.5 - 15.5 %   Platelets 302 150 - 400 K/uL   nRBC 0.0 0.0 - 0.2 %   Neutrophils Relative % 85 %   Neutro Abs 13.7 (H) 1.7 - 7.7 K/uL   Lymphocytes Relative 10 %   Lymphs Abs 1.6 0.7 - 4.0 K/uL   Monocytes Relative 5 %   Monocytes Absolute 0.8 0.1 - 1.0 K/uL   Eosinophils Relative 0 %   Eosinophils Absolute 0.1 0.0 - 0.5 K/uL   Basophils Relative 0 %   Basophils Absolute 0.0 0.0 - 0.1 K/uL   Immature Granulocytes 0 %   Abs Immature Granulocytes 0.07 0.00 - 0.07 K/uL  Comprehensive metabolic panel      Status: Abnormal   Collection Time: 10/23/20 10:23 PM  Result Value Ref Range   Sodium 133 (L) 135 - 145 mmol/L   Potassium 3.4 (L) 3.5 - 5.1 mmol/L   Chloride 100 98 - 111 mmol/L   CO2 24 22 - 32 mmol/L   Glucose, Bld 99 70 - 99 mg/dL   BUN 15 6 - 20 mg/dL   Creatinine, Ser 0.56 0.44 - 1.00 mg/dL   Calcium 8.8 (L) 8.9 - 10.3 mg/dL   Total Protein 7.1 6.5 - 8.1 g/dL   Albumin 3.6 3.5 - 5.0 g/dL   AST 15 15 - 41 U/L   ALT 22 0 - 44 U/L   Alkaline Phosphatase 64 38 - 126 U/L   Total Bilirubin 0.5 0.3 - 1.2 mg/dL   GFR, Estimated >60 >60 mL/min   Anion gap 9 5 - 15  Lipase, blood     Status: None   Collection Time: 10/23/20 10:23 PM  Result Value Ref Range   Lipase 22 11 - 51 U/L  Wet prep,  genital     Status: Abnormal   Collection Time: 10/23/20 10:23 PM   Specimen: Genital  Result Value Ref Range   Yeast Wet Prep HPF POC NONE SEEN NONE SEEN   Trich, Wet Prep NONE SEEN NONE SEEN   Clue Cells Wet Prep HPF POC NONE SEEN NONE SEEN   WBC, Wet Prep HPF POC MANY (A) NONE SEEN   Sperm NONE SEEN     Imaging Studies: CT ABDOMEN PELVIS W CONTRAST  Result Date: 10/24/2020 CLINICAL DATA:  Abdominal pain and fevers EXAM: CT ABDOMEN AND PELVIS WITH CONTRAST TECHNIQUE: Multidetector CT imaging of the abdomen and pelvis was performed using the standard protocol following bolus administration of intravenous contrast. CONTRAST:  17mL OMNIPAQUE IOHEXOL 300 MG/ML  SOLN COMPARISON:  05/07/2018, MRI from 05/19/2018 FINDINGS: Lower chest: No acute abnormality. Hepatobiliary: Mild fatty infiltration of the liver is noted. Stable hypodensity is noted in the left lobe of the liver best seen on image number 15 of series 3 consistent with hemangioma stable from prior MRI. The gallbladder has been surgically removed. Pancreas: Unremarkable. No pancreatic ductal dilatation or surrounding inflammatory changes. Previously seen cystic structure anterior to the pancreas is not visualized on this exam. Spleen:  Normal in size without focal abnormality. Adrenals/Urinary Tract: Adrenal glands are within normal limits. Kidneys demonstrate a normal enhancement pattern bilaterally. Single nonobstructing renal stone is noted in the midportion of the right kidney measuring 1-2 mm. No obstructive changes are noted. Bladder is partially distended. Stomach/Bowel: No obstructive or inflammatory changes of the colon are seen. The appendix is well visualized and within normal limits. Small bowel and stomach appear within normal limits. Vascular/Lymphatic: No significant vascular findings are present. No enlarged abdominal or pelvic lymph nodes. Reproductive: Uterus is within normal limits. Cystic changes are noted within ovaries consistent with follicles. No follow-up is necessary Other: No abdominal wall hernia or abnormality. No abdominopelvic ascites. Musculoskeletal: No acute or significant osseous findings. IMPRESSION: Fatty liver with stable hemangioma in the left lobe. Previously seen cystic lesion anterior to the pancreas seen on prior exams is no longer identified. Single nonobstructing right renal stone. Small cyst exchanges within the ovaries bilaterally consistent with benign follicles. No follow-up is recommended. Electronically Signed   By: Inez Catalina M.D.   On: 10/24/2020 00:02   12:08 AM Patient advised of CT findings.  There is no evidence of an acute surgical condition.  This could represent mittelschmerz given she is approximately midcycle.   Stephana Morell, Jenny Reichmann, MD 10/24/20 [phone]

## 2020-10-25 ENCOUNTER — Other Ambulatory Visit: Payer: Self-pay

## 2020-10-25 ENCOUNTER — Ambulatory Visit (INDEPENDENT_AMBULATORY_CARE_PROVIDER_SITE_OTHER): Payer: BC Managed Care – PPO | Admitting: Obstetrics & Gynecology

## 2020-10-25 ENCOUNTER — Encounter: Payer: Self-pay | Admitting: Obstetrics & Gynecology

## 2020-10-25 VITALS — BP 146/87 | HR 76 | Wt 198.0 lb

## 2020-10-25 DIAGNOSIS — R102 Pelvic and perineal pain: Secondary | ICD-10-CM | POA: Diagnosis not present

## 2020-10-25 LAB — GC/CHLAMYDIA PROBE AMP (~~LOC~~) NOT AT ARMC
Chlamydia: NEGATIVE
Comment: NEGATIVE
Comment: NORMAL
Neisseria Gonorrhea: NEGATIVE

## 2020-10-25 NOTE — Progress Notes (Signed)
GYNECOLOGY OFFICE VISIT NOTE  History:   Julia Mayer is a 30 y.o. [id number] here today for follow up after ER visit on 10/23/20, seen for pelvic pain. Was worked up there, found to have abdominal tenderness on exam. Had increased WBC of 16.2, but negative wet prep and GC/Chlam. CT scan showed bilateral ovarian follicles. She is here today for follow up.  She denies any current abnormal vaginal discharge, bleeding, pelvic pain or other concerns.    Past Medical History:  Diagnosis Date  . Chlamydia   . Gallstones 06/19/2018  . UTI (urinary tract infection)     Past Surgical History:  Procedure Laterality Date  . CHOLECYSTECTOMY N/A 06/19/2018   Procedure: LAPAROSCOPIC CHOLECYSTECTOMY WITH INTRAOPERATIVE CHOLANGIOGRAM ERAS PATHWAY;  Surgeon: Fanny Skates, MD;  Location: [place];  Service: General;  Laterality: N/A;  . ESOPHAGOGASTRODUODENOSCOPY N/A 06/11/2018   Procedure: ESOPHAGOGASTRODUODENOSCOPY (EGD);  Surgeon: Milus Banister, MD;  Location: [facility];  Service: Endoscopy;  Laterality: N/A;  . EUS N/A 06/11/2018   Procedure: UPPER ENDOSCOPIC ULTRASOUND (EUS) RADIAL;  Surgeon: Milus Banister, MD;  Location: [facility];  Service: Endoscopy;  Laterality: N/A;  . 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   Marland Kitchen FINE NEEDLE ASPIRATION N/A 06/11/2018   Procedure: FINE NEEDLE ASPIRATION (FNA) LINEAR;  Surgeon: Milus Banister, MD;  Location: [facility];  Service: Endoscopy;  Laterality: N/A;  . INDUCED ABORTION      The following portions of the patient's history were reviewed and updated as appropriate: allergies, current medications, past family history, past medical history, past social history, past surgical history and problem list.   Health Maintenance:  Normal pap on 11/04/2019 (note said it was done, but not result in Epic noted).   Review of Systems:  Pertinent items noted in HPI and remainder of comprehensive ROS otherwise negative.  Physical Exam:  BP (!) 146/87    Pulse 76   Wt 198 lb (89.8 kg)   LMP 10/11/2020   BMI 36.21 kg/m  CONSTITUTIONAL: Well-developed, well-nourished female in no acute distress.  HEENT:  Normocephalic, atraumatic. External right and left ear normal. No scleral icterus.  NECK: Normal range of motion, supple, no masses noted on observation SKIN: No rash noted. Not diaphoretic. No erythema. No pallor. MUSCULOSKELETAL: Normal range of motion. No edema noted. NEUROLOGIC: Alert and oriented to person, place, and time. Normal muscle tone coordination. No cranial nerve deficit noted. PSYCHIATRIC: Normal mood and affect. Normal behavior. Normal judgment and thought content. CARDIOVASCULAR: Normal heart rate noted RESPIRATORY: Effort and breath sounds normal, no problems with respiration noted ABDOMEN: No masses noted. No other overt distention noted.   PELVIC: Deferred  Labs and Imaging Results for orders placed or performed during the hospital encounter of 10/23/20 (from the past 168 hour(s))  Urinalysis, Routine w reflex microscopic Urine, Clean Catch   Collection Time: 10/23/20  9:17 PM  Result Value Ref Range   Color, Urine YELLOW YELLOW   APPearance CLEAR CLEAR   Specific Gravity, Urine 1.020 1.005 - 1.030   pH 6.0 5.0 - 8.0   Glucose, UA NEGATIVE NEGATIVE mg/dL   Hgb urine dipstick NEGATIVE NEGATIVE   Bilirubin Urine NEGATIVE NEGATIVE   Ketones, ur NEGATIVE NEGATIVE mg/dL   Protein, ur NEGATIVE NEGATIVE mg/dL   Nitrite NEGATIVE NEGATIVE   Leukocytes,Ua NEGATIVE NEGATIVE  Pregnancy, urine   Collection Time: 10/23/20  9:17 PM  Result Value Ref Range   Preg Test, Ur NEGATIVE NEGATIVE  Wet prep, genital   Collection Time: 10/23/20 10:23 PM   Specimen: Genital  Result Value Ref Range   Yeast Wet Prep HPF POC NONE SEEN NONE SEEN   Trich, Wet Prep NONE SEEN NONE SEEN   Clue Cells Wet Prep HPF POC NONE SEEN NONE SEEN   WBC, Wet Prep HPF POC MANY (A) NONE SEEN   Sperm NONE SEEN   CBC with Differential/Platelet    Collection Time: 10/23/20 10:23 PM  Result Value Ref Range   WBC 16.2 (H) 4.0 - 10.5 K/uL   RBC 4.22 3.87 - 5.11 MIL/uL   Hemoglobin 12.2 12.0 - 15.0 g/dL   HCT 37.7 36.0 - 46.0 %   MCV 89.3 80.0 - 100.0 fL   MCH 28.9 26.0 - 34.0 pg   MCHC 32.4 30.0 - 36.0 g/dL   RDW 13.2 11.5 - 15.5 %   Platelets 302 150 - 400 K/uL   nRBC 0.0 0.0 - 0.2 %   Neutrophils Relative % 85 %   Neutro Abs 13.7 (H) 1.7 - 7.7 K/uL   Lymphocytes Relative 10 %   Lymphs Abs 1.6 0.7 - 4.0 K/uL   Monocytes Relative 5 %   Monocytes Absolute 0.8 0.1 - 1.0 K/uL   Eosinophils Relative 0 %   Eosinophils Absolute 0.1 0.0 - 0.5 K/uL   Basophils Relative 0 %   Basophils Absolute 0.0 0.0 - 0.1 K/uL   Immature Granulocytes 0 %   Abs Immature Granulocytes 0.07 0.00 - 0.07 K/uL  Comprehensive metabolic panel   Collection Time: 10/23/20 10:23 PM  Result Value Ref Range   Sodium 133 (L) 135 - 145 mmol/L   Potassium 3.4 (L) 3.5 - 5.1 mmol/L   Chloride 100 98 - 111 mmol/L   CO2 24 22 - 32 mmol/L   Glucose, Bld 99 70 - 99 mg/dL   BUN 15 6 - 20 mg/dL   Creatinine, Ser 0.56 0.44 - 1.00 mg/dL   Calcium 8.8 (L) 8.9 - 10.3 mg/dL   Total Protein 7.1 6.5 - 8.1 g/dL   Albumin 3.6 3.5 - 5.0 g/dL   AST 15 15 - 41 U/L   ALT 22 0 - 44 U/L   Alkaline Phosphatase 64 38 - 126 U/L   Total Bilirubin 0.5 0.3 - 1.2 mg/dL   GFR, Estimated >60 >60 mL/min   Anion gap 9 5 - 15  Lipase, blood   Collection Time: 10/23/20 10:23 PM  Result Value Ref Range   Lipase 22 11 - 51 U/L  GC/Chlamydia probe amp ([place])not at [facility]   Collection Time: 10/23/20 10:23 PM  Result Value Ref Range   Chlamydia Negative    Neisseria Gonorrhea Negative    Comment Normal Reference [place] Chlamydia - Negative    Comment      Normal Reference Range Neisseria Gonorrhea - Negative   CT ABDOMEN PELVIS W CONTRAST  Result Date: 10/24/2020 CLINICAL DATA:  Abdominal pain and fevers EXAM: CT ABDOMEN AND PELVIS WITH CONTRAST TECHNIQUE: Multidetector CT  imaging of the abdomen and pelvis was performed using the standard protocol following bolus administration of intravenous contrast. CONTRAST:  185mL OMNIPAQUE IOHEXOL 300 MG/ML  SOLN COMPARISON:  05/07/2018, MRI from 05/19/2018 FINDINGS: Lower chest: No acute abnormality. Hepatobiliary: Mild fatty infiltration of the liver is noted. Stable hypodensity is noted in the left lobe of the liver best seen on image number 15 of series 3 consistent with hemangioma stable from prior MRI. The gallbladder has been surgically removed. Pancreas:  Unremarkable. No pancreatic ductal dilatation or surrounding inflammatory changes. Previously seen cystic structure anterior to the pancreas is not visualized on this exam. Spleen: Normal in size without focal abnormality. Adrenals/Urinary Tract: Adrenal glands are within normal limits. Kidneys demonstrate a normal enhancement pattern bilaterally. Single nonobstructing renal stone is noted in the midportion of the right kidney measuring 1-2 mm. No obstructive changes are noted. Bladder is partially distended. Stomach/Bowel: No obstructive or inflammatory changes of the colon are seen. The appendix is well visualized and within normal limits. Small bowel and stomach appear within normal limits. Vascular/Lymphatic: No significant vascular findings are present. No enlarged abdominal or pelvic lymph nodes. Reproductive: Uterus is within normal limits. Cystic changes are noted within ovaries consistent with follicles. No follow-up is necessary Other: No abdominal wall hernia or abnormality. No abdominopelvic ascites. Musculoskeletal: No acute or significant osseous findings. IMPRESSION: Fatty liver with stable hemangioma in the left lobe. Previously seen cystic lesion anterior to the pancreas seen on prior exams is no longer identified. Single nonobstructing right renal stone. Small cyst exchanges within the ovaries bilaterally consistent with benign follicles. No follow-up is recommended.  Electronically Signed   By: Inez Catalina M.D.   On: 10/24/2020 00:02      Assessment and Plan:    1. Pelvic pain No ovarian cysts needing intervention, no evidence of PID currently CT scan results reviewed in detail with patient, all questions answered. Patient advised to call back if pain worsens or for other GYN concerns.   Routine preventative health maintenance measures emphasized, will attempt to track down 11/04/2019 pap results.  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 Please refer to After Visit Summary for other counseling recommendations.   Return in about 2 weeks (around 11/08/2020) for Needs repeat pap smear (with Dr. Jodi Mourning).    I spent 15 minutes dedicated to the care of this patient including pre-visit review of records, face to face time with the patient discussing her conditions and treatments and post visit ordering of testing.    Verita Schneiders, MD, [place] for [employer], [place]

## 2020-10-26 LAB — CYTOLOGY - PAP: Diagnosis: NEGATIVE

## 2020-11-02 ENCOUNTER — Other Ambulatory Visit: Payer: Self-pay | Admitting: Family Medicine

## 2020-11-02 DIAGNOSIS — R5381 Other malaise: Secondary | ICD-10-CM

## 2020-11-07 ENCOUNTER — Other Ambulatory Visit: Payer: Self-pay | Admitting: Physician Assistant

## 2020-11-09 ENCOUNTER — Other Ambulatory Visit: Payer: Self-pay | Admitting: Physician Assistant

## 2020-11-13 ENCOUNTER — Encounter: Payer: Self-pay | Admitting: Obstetrics

## 2020-11-13 ENCOUNTER — Other Ambulatory Visit: Payer: Self-pay

## 2020-11-13 ENCOUNTER — Other Ambulatory Visit (HOSPITAL_COMMUNITY)
Admission: RE | Admit: 2020-11-13 | Discharge: 2020-11-13 | Disposition: A | Discharge status: Home / Self Care | Payer: BC Managed Care – PPO | Source: Ambulatory Visit | Attending: Obstetrics | Admitting: Obstetrics

## 2020-11-13 ENCOUNTER — Ambulatory Visit (INDEPENDENT_AMBULATORY_CARE_PROVIDER_SITE_OTHER): Payer: BC Managed Care – PPO | Admitting: Obstetrics

## 2020-11-13 VITALS — BP 132/93 | HR 68 | Ht 62.0 in | Wt 193.0 lb

## 2020-11-13 DIAGNOSIS — N898 Other specified noninflammatory disorders of vagina: Secondary | ICD-10-CM

## 2020-11-13 DIAGNOSIS — Z113 Encounter for screening for infections with a predominantly sexual mode of transmission: Secondary | ICD-10-CM | POA: Diagnosis not present

## 2020-11-13 DIAGNOSIS — B369 Superficial mycosis, unspecified: Secondary | ICD-10-CM | POA: Diagnosis not present

## 2020-11-13 DIAGNOSIS — Z01419 Encounter for gynecological examination (general) (routine) without abnormal findings: Secondary | ICD-10-CM | POA: Insufficient documentation

## 2020-11-13 DIAGNOSIS — Z3009 Encounter for other general counseling and advice on contraception: Secondary | ICD-10-CM

## 2020-11-13 DIAGNOSIS — E669 Obesity, unspecified: Secondary | ICD-10-CM

## 2020-11-13 MED ORDER — CLOTRIMAZOLE 1 % EX CREA: 1.0000 "application " | TOPICAL_CREAM | Freq: Two times a day (BID) | CUTANEOUS | 0 refills | Status: DC

## 2020-11-13 MED ORDER — CLOTRIMAZOLE 1 % EX CREA: 1.0000 "application " | TOPICAL_CREAM | Freq: Two times a day (BID) | CUTANEOUS | 1 refills | Status: DC

## 2020-11-13 NOTE — Progress Notes (Signed)
Subjective:        Julia Mayer is a 30 y.o. female here for a routine exam.  Current complaints: Itching in areola area of breasts, no rash.  Also c/o vaginal discharge.    Personal health questionnaire:  Is patient Ashkenazi Jewish, have a family history of breast and/or ovarian cancer: no Is there a family history of uterine cancer diagnosed at age < 14, gastrointestinal cancer, urinary tract cancer, family member who is a Field seismologist syndrome-associated carrier: no Is the patient overweight and hypertensive, family history of diabetes, personal history of gestational diabetes, preeclampsia or PCOS: no Is patient over 80, have PCOS,  family history of premature CHD under age 42, diabetes, smoke, have hypertension or peripheral artery disease:  no At any time, has a partner hit, kicked or otherwise hurt or frightened you?: no Over the past 2 weeks, have you felt down, depressed or hopeless?: no Over the past 2 weeks, have you felt little interest or pleasure in doing things?:no   Gynecologic History Patient's last menstrual period was 11/01/2020. Contraception: none Last Pap: 11-04-2019. Results were: normal Last mammogram: n/a. Results were: n/a  Obstetric History OB History  Gravida Para Term Preterm AB Living  5 2 2  0 3 2  SAB IAB Ectopic Multiple Live Births  0 3 0 0 2    # Outcome Date GA Lbr Len/2nd Weight Sex Delivery Anes PTL Lv  5 Term 07/25/16 [redacted]w[redacted]d 12:23 / 00:17 7 lb 12.2 oz (3.52 kg) F Vag-Spont EPI  LIV  4 Term 07/20/11 [redacted]w[redacted]d 375:02 / 00:27  F Vag-Spont   LIV  3 IAB           2 IAB           1 IAB             Past Medical History:  Diagnosis Date  . Chlamydia   . Gallstones 06/19/2018  . UTI (urinary tract infection)     Past Surgical History:  Procedure Laterality Date  . CHOLECYSTECTOMY N/A 06/19/2018   Procedure: LAPAROSCOPIC CHOLECYSTECTOMY WITH INTRAOPERATIVE CHOLANGIOGRAM ERAS PATHWAY;  Surgeon: Fanny Skates, MD;  Location: [facility];  Service: General;   Laterality: N/A;  . ESOPHAGOGASTRODUODENOSCOPY N/A 06/11/2018   Procedure: ESOPHAGOGASTRODUODENOSCOPY (EGD);  Surgeon: Milus Banister, MD;  Location: [facility];  Service: Endoscopy;  Laterality: N/A;  . EUS N/A 06/11/2018   Procedure: UPPER ENDOSCOPIC ULTRASOUND (EUS) RADIAL;  Surgeon: Milus Banister, MD;  Location: [facility];  Service: Endoscopy;  Laterality: N/A;  . 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   Marland Kitchen FINE NEEDLE ASPIRATION N/A 06/11/2018   Procedure: FINE NEEDLE ASPIRATION (FNA) LINEAR;  Surgeon: Milus Banister, MD;  Location: [facility];  Service: Endoscopy;  Laterality: N/A;  . INDUCED ABORTION       Current Outpatient Medications:  .  amLODipine (NORVASC) 5 MG tablet, Take by mouth., Disp: , Rfl:  .  clotrimazole (LOTRIMIN) 1 % cream, Apply 1 application topically 2 (two) times daily., Disp: 60 g, Rfl: 1 .  ondansetron (ZOFRAN ODT) 8 MG disintegrating tablet, Take 1 tablet (8 mg total) by mouth every 8 (eight) hours as needed for nausea or vomiting. (Patient not taking: No sig reported), Disp: 10 tablet, Rfl: 0 .  oxyCODONE-acetaminophen (PERCOCET) 5-325 MG tablet, Take 1 tablet by mouth every 6 (six) hours as needed for severe pain. (Patient not taking: No sig reported), Disp: 12 tablet, Rfl: 0 Allergies  Allergen  Reactions  . Bactrim [Sulfamethoxazole-Trimethoprim] Hives    Social History   Tobacco Use  . Smoking status: Never Smoker  . Smokeless tobacco: Never Used  Substance Use Topics  . Alcohol use: No    Comment: occasionally    Family History  Problem Relation Age of Onset  . Hypertension Mother   . Asthma Mother   . Alcohol abuse Mother   . Hypertension Father   . Hyperlipidemia Father   . Breast cancer Maternal Grandmother   . Cancer Paternal Grandmother   . Anesthesia problems Paternal Grandmother       Review of Systems  Constitutional: negative for fatigue and weight loss Respiratory: negative for cough and  wheezing Cardiovascular: negative for chest pain, fatigue and palpitations Gastrointestinal: negative for abdominal pain and change in bowel habits Musculoskeletal:negative for myalgias Neurological: negative for gait problems and tremors Behavioral/Psych: negative for abusive relationship, depression Endocrine: negative for temperature intolerance    Genitourinary: positive for vaginal discharge.  negative for abnormal menstrual periods, genital lesions, hot flashes, sexual problems Integument/breast:  Positive for itching in areola area, bilaterally.  negative for breast lump, breast tenderness, nipple discharge and skin lesion(s)   Objective:       BP (!) 132/93   Pulse 68   Ht 5\' 2"  (1.575 m)   Wt 193 lb (87.5 kg)   LMP 11/01/2020   BMI 35.30 kg/m  General:   alert and no distress  Skin:   no rash or abnormalities  Lungs:   clear to auscultation bilaterally  Heart:   regular rate and rhythm, S1, S2 normal, no murmur, click, rub or gallop  Breasts:   normal without suspicious masses, skin or nipple changes or axillary nodes  Abdomen:  normal findings: no organomegaly, soft, non-tender and no hernia  Pelvis:  External genitalia: normal general appearance Urinary system: urethral meatus normal and bladder without fullness, nontender Vaginal: normal without tenderness, induration or masses Cervix: normal appearance Adnexa: normal bimanual exam Uterus: anteverted and non-tender, normal size   Lab Review Urine pregnancy test Labs reviewed yes Radiologic studies reviewed no  I have spent a total of 20 minutes of face-to-face time, excluding clinical staff time, reviewing notes and preparing to see patient, ordering tests and/or medications, and counseling the patient.   Assessment:     1. Encounter for routine gynecological examination with Papanicolaou smear of cervix Rx: - Cytology - PAP( [facility])  2. Vaginal discharge Rx: - Cervicovaginal ancillary only( [facility])  3. Screening examination for STD (sexually transmitted disease) Rx: - HIV Antibody (routine testing w rflx) - Hepatitis B surface antigen - RPR - Hepatitis C antibody  4. Superficial fungus infection of skin Rx: - clotrimazole (LOTRIMIN) 1 % cream; Apply 1 application topically 2 (two) times daily.  Dispense: 60 g; Refill: 1  5. Encounter for counseling regarding contraception - undecided.  Options discussed.   - condoms recommended for STD prevention  6. Obesity (BMI 35.0-39.9 without comorbidity) - weight management recommended    Plan:    Education reviewed: calcium supplements, depression evaluation, low fat, low cholesterol diet, safe sex/STD prevention, self breast exams and weight bearing exercise. Contraception: none. Follow up in: 1 year.   Meds ordered this encounter  Medications  . DISCONTD: clotrimazole (LOTRIMIN) 1 % cream    Sig: Apply 1 application topically 2 (two) times daily.    Dispense:  30 g    Refill:  0  . clotrimazole (LOTRIMIN) 1 % cream  Sig: Apply 1 application topically 2 (two) times daily.    Dispense:  60 g    Refill:  1   Orders Placed This Encounter  Procedures  . HIV Antibody (routine testing w rflx)  . Hepatitis B surface antigen  . RPR  . Hepatitis C antibody    Shelly Bombard, MD 11/13/2020 10:53 AM

## 2020-11-14 ENCOUNTER — Other Ambulatory Visit: Payer: Self-pay | Admitting: Obstetrics

## 2020-11-14 DIAGNOSIS — B9689 Other specified bacterial agents as the cause of diseases classified elsewhere: Secondary | ICD-10-CM

## 2020-11-14 DIAGNOSIS — N760 Acute vaginitis: Secondary | ICD-10-CM

## 2020-11-14 LAB — CERVICOVAGINAL ANCILLARY ONLY
Bacterial Vaginitis (gardnerella): POSITIVE — AB
Candida Glabrata: NEGATIVE
Candida Vaginitis: NEGATIVE
Chlamydia: NEGATIVE
Comment: NEGATIVE
Comment: NEGATIVE
Comment: NEGATIVE
Comment: NEGATIVE
Comment: NEGATIVE
Comment: NORMAL
Neisseria Gonorrhea: NEGATIVE
Trichomonas: NEGATIVE

## 2020-11-14 LAB — HEPATITIS C ANTIBODY: Hep C Virus Ab: <0.1 s/co ratio (ref 0.0–0.9)

## 2020-11-14 LAB — HIV ANTIBODY (ROUTINE TESTING W REFLEX): HIV Screen 4th Generation wRfx: NONREACTIVE

## 2020-11-14 LAB — RPR: RPR Ser Ql: NONREACTIVE

## 2020-11-14 LAB — HEPATITIS B SURFACE ANTIGEN: Hepatitis B Surface Ag: NEGATIVE

## 2020-11-14 LAB — CYTOLOGY - PAP
Comment: NEGATIVE
Diagnosis: NEGATIVE
High risk HPV: POSITIVE — AB

## 2020-11-14 MED ORDER — METRONIDAZOLE 500 MG PO TABS
500.0000 mg | ORAL_TABLET | Freq: Two times a day (BID) | ORAL | 2 refills | Status: AC
Start: 2020-11-14 — End: ?

## 2020-11-15 ENCOUNTER — Other Ambulatory Visit: Payer: Self-pay | Admitting: Family Medicine

## 2020-11-15 ENCOUNTER — Other Ambulatory Visit: Payer: Self-pay | Admitting: Physician Assistant

## 2020-11-15 DIAGNOSIS — N644 Mastodynia: Secondary | ICD-10-CM

## 2020-12-12 ENCOUNTER — Other Ambulatory Visit: Payer: Self-pay | Admitting: Family Medicine

## 2020-12-12 DIAGNOSIS — N644 Mastodynia: Secondary | ICD-10-CM

## 2020-12-13 ENCOUNTER — Other Ambulatory Visit: Payer: Self-pay

## 2020-12-13 ENCOUNTER — Ambulatory Visit
Admission: RE | Admit: 2020-12-13 | Discharge: 2020-12-13 | Disposition: A | Discharge status: Home / Self Care | Payer: BC Managed Care – PPO | Source: Ambulatory Visit | Attending: Family Medicine | Admitting: Family Medicine

## 2020-12-13 ENCOUNTER — Other Ambulatory Visit: Payer: Self-pay | Admitting: Family Medicine

## 2020-12-13 DIAGNOSIS — R921 Mammographic calcification found on diagnostic imaging of breast: Secondary | ICD-10-CM

## 2020-12-13 DIAGNOSIS — N644 Mastodynia: Secondary | ICD-10-CM

## 2021-02-27 ENCOUNTER — Emergency Department (HOSPITAL_COMMUNITY)
Admission: EM | Admit: 2021-02-27 | Discharge: 2021-02-28 | Disposition: A | Discharge status: Home / Self Care | Payer: BC Managed Care – PPO | Attending: Emergency Medicine | Admitting: Emergency Medicine

## 2021-02-27 ENCOUNTER — Encounter (HOSPITAL_COMMUNITY): Payer: Self-pay

## 2021-02-27 ENCOUNTER — Other Ambulatory Visit: Payer: Self-pay

## 2021-02-27 ENCOUNTER — Emergency Department (HOSPITAL_COMMUNITY): Payer: BC Managed Care – PPO

## 2021-02-27 DIAGNOSIS — N9489 Other specified conditions associated with female genital organs and menstrual cycle: Secondary | ICD-10-CM | POA: Insufficient documentation

## 2021-02-27 DIAGNOSIS — Z20822 Contact with and (suspected) exposure to covid-19: Secondary | ICD-10-CM | POA: Diagnosis not present

## 2021-02-27 DIAGNOSIS — R079 Chest pain, unspecified: Secondary | ICD-10-CM | POA: Diagnosis present

## 2021-02-27 DIAGNOSIS — J069 Acute upper respiratory infection, unspecified: Secondary | ICD-10-CM | POA: Insufficient documentation

## 2021-02-27 DIAGNOSIS — R0789 Other chest pain: Secondary | ICD-10-CM

## 2021-02-27 LAB — CBC WITH DIFFERENTIAL/PLATELET
Abs Immature Granulocytes: 0.05 10*3/uL (ref 0.00–0.07)
Basophils Absolute: 0 10*3/uL (ref 0.0–0.1)
Basophils Relative: 0 %
Eosinophils Absolute: 0.2 10*3/uL (ref 0.0–0.5)
Eosinophils Relative: 2 %
HCT: 38.8 % (ref 36.0–46.0)
Hemoglobin: 12.5 g/dL (ref 12.0–15.0)
Immature Granulocytes: 1 %
Lymphocytes Relative: 29 %
Lymphs Abs: 3.1 10*3/uL (ref 0.7–4.0)
MCH: 29.1 pg (ref 26.0–34.0)
MCHC: 32.2 g/dL (ref 30.0–36.0)
MCV: 90.4 fL (ref 80.0–100.0)
Monocytes Absolute: 0.7 10*3/uL (ref 0.1–1.0)
Monocytes Relative: 7 %
Neutro Abs: 6.5 10*3/uL (ref 1.7–7.7)
Neutrophils Relative %: 61 %
Platelets: 335 10*3/uL (ref 150–400)
RBC: 4.29 MIL/uL (ref 3.87–5.11)
RDW: 12.8 % (ref 11.5–15.5)
WBC: 10.6 10*3/uL — ABNORMAL HIGH (ref 4.0–10.5)
nRBC: 0 % (ref 0.0–0.2)

## 2021-02-27 LAB — COMPREHENSIVE METABOLIC PANEL
ALT: 21 U/L (ref 0–44)
AST: 14 U/L — ABNORMAL LOW (ref 15–41)
Albumin: 3.3 g/dL — ABNORMAL LOW (ref 3.5–5.0)
Alkaline Phosphatase: 73 U/L (ref 38–126)
Anion gap: 7 (ref 5–15)
BUN: 16 mg/dL (ref 6–20)
CO2: 26 mmol/L (ref 22–32)
Calcium: 9.1 mg/dL (ref 8.9–10.3)
Chloride: 103 mmol/L (ref 98–111)
Creatinine, Ser: 0.84 mg/dL (ref 0.44–1.00)
GFR, Estimated: >60 mL/min (ref >60)
Glucose, Bld: 93 mg/dL (ref 70–99)
Potassium: 3.7 mmol/L (ref 3.5–5.1)
Sodium: 136 mmol/L (ref 135–145)
Total Bilirubin: 0.2 mg/dL — ABNORMAL LOW (ref 0.3–1.2)
Total Protein: 6.8 g/dL (ref 6.5–8.1)

## 2021-02-27 LAB — TROPONIN I (HIGH SENSITIVITY): Troponin I (High Sensitivity): 3 ng/L (ref <18)

## 2021-02-27 LAB — LIPASE, BLOOD: Lipase: 29 U/L (ref 11–51)

## 2021-02-27 LAB — I-STAT BETA HCG BLOOD, ED (MC, WL, AP ONLY): I-stat hCG, quantitative: <5 m[IU]/mL (ref <5)

## 2021-02-27 MED ORDER — ALBUTEROL SULFATE HFA 108 (90 BASE) MCG/ACT IN AERS
2.0000 | INHALATION_SPRAY | RESPIRATORY_TRACT | Status: DC | PRN
  Administered 2021-02-27: 2 via RESPIRATORY_TRACT
  Filled 2021-02-27: qty 6.7

## 2021-02-27 MED ORDER — AEROCHAMBER PLUS FLO-VU LARGE MISC
1.0000 | Freq: Once | Status: AC
  Administered 2021-02-27: 1

## 2021-02-27 MED ORDER — BACITRACIN ZINC 500 UNIT/GM EX OINT
1.0000 "application " | TOPICAL_OINTMENT | Freq: Two times a day (BID) | CUTANEOUS | Status: DC
  Administered 2021-02-27: 1 via TOPICAL
  Filled 2021-02-27: qty 0.9

## 2021-02-27 MED ORDER — IBUPROFEN 400 MG PO TABS
600.0000 mg | ORAL_TABLET | Freq: Once | ORAL | Status: AC
  Administered 2021-02-27: 600 mg via ORAL
  Filled 2021-02-27: qty 1

## 2021-02-27 NOTE — ED Triage Notes (Signed)
Pt c/o chest pain since Saturday. Pt also c/o left shoulder pain.

## 2021-02-27 NOTE — ED Provider Notes (Signed)
Emergency Medicine Provider Triage Evaluation Note  Julia Mayer , a 30 y.o. female  was evaluated in triage.  Pt complains of chest pressure since Saturday.  She states that it feels achy.  Her pain worsens and changes with movements.  She denies any shortness of breath.  She denies any recent trauma.  No leg swelling.  No.  Review of Systems  Positive: Aching chest pressure Negative: Numbness, weakness, tingling, cough, shortness of breath  Physical Exam  BP (!) 127/95 (BP Location: Left Arm)   Pulse 72   Temp 98.1 F (36.7 C) (Oral)   Resp 16   Ht '5\' 2"'$  (1.575 m)   Wt 86.2 kg   SpO2 99%   BMI 34.75 kg/m  Gen:   Awake, no distress   Resp:  Normal effort  MSK:   Moves extremities without difficulty  Other:  Patient is awake and alert, generally well-appearing.  Answers questions appropriately.  Medical Decision Making  Medically screening exam initiated at 8:00 PM.  Appropriate orders placed.  Nella Tragesser Raatz was informed that the remainder of the evaluation will be completed by another provider, this initial triage assessment does not replace that evaluation, and the importance of remaining in the ED until their evaluation is complete.  Note: Portions of this report may have been transcribed using voice recognition software. Every effort was made to ensure accuracy; however, inadvertent computerized transcription errors may be present    Ollen Gross 02/27/21 2001    Godfrey Pick, MD 02/28/21 [phone]

## 2021-02-27 NOTE — ED Provider Notes (Signed)
[facility] EMERGENCY DEPARTMENT Provider Note   CSN: [id number] Arrival date & time: 02/27/21  1805     History Chief Complaint  Patient presents with   Chest Pain    Julia Mayer is a 30 y.o. female presents to the Emergency Department complaining of gradual, persistent, progressively worsening central chest pressure and achiness onset Saturday afternoon.  Discomfort has been constant since that time waxing and waning but not resolving.  She reports she is also had associated URI symptoms including nasal congestion, postnasal drip, bilateral ear fullness and dry cough.  Patient reports that chest discomfort is worse when she is moving around but not specifically worse with taking a deep breath.  No treatments prior to arrival.  Patient reports home test for COVID which was negative several days ago.  No other known sick contacts.  She is vaccinated for COVID.  Denies recent travel or leg swelling, palpitations, syncope/near syncope, history of lupus.  No previous history of DVT/PE.   The history is provided by the patient and medical records. No language interpreter was used.      Past Medical History:  Diagnosis Date   Chlamydia    Gallstones 06/19/2018   UTI (urinary tract infection)     Patient Active Problem List   Diagnosis Date Noted   Elevated blood-pressure reading without diagnosis of hypertension 09/08/2020   Abnormal Pap smear of cervix 05/19/2019   Obesity (BMI 30-39.9) 05/19/2019   Gallstones 06/19/2018   Abnormal CT of the abdomen    Candida vaginitis 01/28/2011    Past Surgical History:  Procedure Laterality Date   CHOLECYSTECTOMY N/A 06/19/2018   Procedure: LAPAROSCOPIC CHOLECYSTECTOMY WITH INTRAOPERATIVE CHOLANGIOGRAM ERAS PATHWAY;  Surgeon: Fanny Skates, MD;  Location: [facility];  Service: General;  Laterality: N/A;   ESOPHAGOGASTRODUODENOSCOPY N/A 06/11/2018   Procedure: ESOPHAGOGASTRODUODENOSCOPY (EGD);  Surgeon: Milus Banister, MD;   Location: [facility];  Service: Endoscopy;  Laterality: N/A;   EUS N/A 06/11/2018   Procedure: UPPER ENDOSCOPIC ULTRASOUND (EUS) RADIAL;  Surgeon: Milus Banister, MD;  Location: [facility];  Service: Endoscopy;  Laterality: N/A;   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    FINE NEEDLE ASPIRATION N/A 06/11/2018   Procedure: FINE NEEDLE ASPIRATION (FNA) LINEAR;  Surgeon: Milus Banister, MD;  Location: [facility];  Service: Endoscopy;  Laterality: N/A;   INDUCED ABORTION       OB History     Gravida  5   Para  2   Term  2   Preterm  0   AB  3   Living  2      SAB  0   IAB  3   Ectopic  0   Multiple  0   Live Births  2           Family History  Problem Relation Age of Onset   Hypertension Mother    Asthma Mother    Alcohol abuse Mother    Hypertension Father    Hyperlipidemia Father    Breast cancer Maternal Grandmother    Cancer Paternal Grandmother    Anesthesia problems Paternal Grandmother     Social History   Tobacco Use   Smoking status: Never   Smokeless tobacco: Never  Vaping Use   Vaping Use: Never used  Substance Use Topics   Alcohol use: No    Comment: occasionally   Drug use: No    Home Medications Prior to  Admission medications   Medication Sig Start Date End Date Taking? Authorizing Provider  amLODipine (NORVASC) 5 MG tablet Take by mouth. 09/08/20  Yes [provider]  bacitracin ointment Apply 1 application topically 2 (two) times daily. 02/28/21  Yes Cyriah Childrey, Jarrett Soho, PA-C  fluticasone (FLONASE) 50 MCG/ACT nasal spray Place 2 sprays into both nostrils daily. 02/28/21  Yes Aavya Shafer, Jarrett Soho, PA-C  clotrimazole (LOTRIMIN) 1 % cream Apply 1 application topically 2 (two) times daily. Patient not taking: Reported on 02/27/2021 11/13/20   Shelly Bombard, MD  metroNIDAZOLE (FLAGYL) 500 MG tablet Take 1 tablet (500 mg total) by mouth 2 (two) times daily. Patient not taking: Reported on 02/27/2021 11/14/20   Shelly Bombard, MD  ondansetron (ZOFRAN ODT) 8 MG disintegrating tablet Take 1 tablet (8 mg total) by mouth every 8 (eight) hours as needed for nausea or vomiting. Patient not taking: No sig reported 10/24/20   Molpus, Jenny Reichmann, MD  oxyCODONE-acetaminophen (PERCOCET) 5-325 MG tablet Take 1 tablet by mouth every 6 (six) hours as needed for severe pain. Patient not taking: No sig reported 10/24/20   Molpus, John, MD    Allergies    Bactrim [sulfamethoxazole-trimethoprim]  Review of Systems   Review of Systems  Constitutional:  Negative for appetite change, diaphoresis, fatigue, fever and unexpected weight change.  HENT:  Positive for congestion, ear pain and postnasal drip. Negative for mouth sores.   Eyes:  Negative for visual disturbance.  Respiratory:  Positive for cough. Negative for chest tightness, shortness of breath and wheezing.   Cardiovascular:  Positive for chest pain.  Gastrointestinal:  Negative for abdominal pain, constipation, diarrhea, nausea and vomiting.  Endocrine: Negative for polydipsia, polyphagia and polyuria.  Genitourinary:  Negative for dysuria, frequency, hematuria and urgency.  Musculoskeletal:  Negative for back pain and neck stiffness.  Skin:  Negative for rash.  Allergic/Immunologic: Negative for immunocompromised state.  Neurological:  Negative for syncope, light-headedness and headaches.  Hematological:  Does not bruise/bleed easily.  Psychiatric/Behavioral:  Negative for sleep disturbance. The patient is not nervous/anxious.    Physical Exam Updated Vital Signs BP (!) 121/92   Pulse 77   Temp 98.1 F (36.7 C) (Oral)   Resp 16   Ht '5\' 2"'$  (1.575 m)   Wt 86.2 kg   SpO2 100%   BMI 34.75 kg/m   Physical Exam Vitals and nursing note reviewed.  Constitutional:      General: She is not in acute distress.    Appearance: She is not diaphoretic.  HENT:     Head: Normocephalic.     Right Ear: Ear canal normal. A middle ear effusion (clear) is present. Tympanic  membrane is not injected, perforated or bulging.     Left Ear: A middle ear effusion (clear) is present. Tympanic membrane is not injected, perforated or bulging.     Ears:   Eyes:     General: No scleral icterus.    Conjunctiva/sclera: Conjunctivae normal.  Cardiovascular:     Rate and Rhythm: Normal rate and regular rhythm.     Pulses: Normal pulses.          Radial pulses are 2+ on the right side and 2+ on the left side.  Pulmonary:     Effort: No tachypnea, accessory muscle usage, prolonged expiration, respiratory distress or retractions.     Breath sounds: No stridor.     Comments: Equal chest rise. No increased work of breathing. Coarse breath sounds throughout without focal abnormality Abdominal:  General: There is no distension.     Palpations: Abdomen is soft.     Tenderness: There is no abdominal tenderness. There is no guarding or rebound.  Musculoskeletal:     Cervical back: Normal range of motion.     Comments: Moves all extremities equally and without difficulty.  Skin:    General: Skin is warm and dry.     Capillary Refill: Capillary refill takes less than 2 seconds.  Neurological:     Mental Status: She is alert.     GCS: GCS eye subscore is 4. GCS verbal subscore is 5. GCS motor subscore is 6.     Comments: Speech is clear and goal oriented.  Psychiatric:        Mood and Affect: Mood normal.    ED Results / Procedures / Treatments   Labs (all labs ordered are listed, but only abnormal results are displayed) Labs Reviewed  COMPREHENSIVE METABOLIC PANEL - Abnormal; Notable for the following components:      Result Value   Albumin 3.3 (*)    AST 14 (*)    Total Bilirubin 0.2 (*)    All other components within normal limits  CBC WITH DIFFERENTIAL/PLATELET - Abnormal; Notable for the following components:   WBC 10.6 (*)    All other components within normal limits  SARS CORONAVIRUS 2 (TAT 6-24 HRS)  LIPASE, BLOOD  I-STAT BETA HCG BLOOD, ED ([facility], [facility], [facility]  ONLY)  TROPONIN I (HIGH SENSITIVITY)  TROPONIN I (HIGH SENSITIVITY)    EKG EKG Interpretation  Date/Time:  Tuesday February 27 2021 19:48:31 EDT Ventricular Rate:  79 PR Interval:  166 QRS Duration: 70 QT Interval:  350 QTC Calculation: 401 R Axis:   50 Text Interpretation: Normal sinus rhythm Nonspecific T wave abnormality Abnormal ECG Confirmed by Fredia Sorrow [phone]) on 02/27/2021 11:29:59 PM  Radiology DG Chest 2 View  Result Date: 02/27/2021 CLINICAL DATA:  Chest pressure EXAM: CHEST - 2 VIEW COMPARISON:  09/14/2020 FINDINGS: The heart size and mediastinal contours are within normal limits. Both lungs are clear. The visualized skeletal structures are unremarkable. IMPRESSION: No active cardiopulmonary disease. Electronically Signed   By: Rolm Baptise M.D.   On: 02/27/2021 20:37    Procedures Procedures   Medications Ordered in ED Medications  albuterol (VENTOLIN HFA) 108 (90 Base) MCG/ACT inhaler 2 puff (2 puffs Inhalation Given 02/27/21 2307)  bacitracin ointment 1 application (1 application Topical Given 02/27/21 2307)  ibuprofen (ADVIL) tablet 600 mg (600 mg Oral Given 02/27/21 2307)  AeroChamber Plus Flo-Vu Large MISC 1 each (1 each Other Given 02/27/21 2307)    ED Course  I have reviewed the triage vital signs and the nursing notes.  Pertinent labs & imaging results that were available during my care of the patient were reviewed by me and considered in my medical decision making (see chart for details).  Clinical Course as of 02/28/21 0156  Tue Feb 27, 2021  2217 Lipase: 29 WNL [HM]  2217 ALT: 21 WNL [HM]    Clinical Course User Index [HM] Ivie Maese, Gwenlyn Perking   MDM Rules/Calculators/A&P                           Patient presents with ongoing chest discomfort.  EKG with some nonspecific T wave abnormality however unchanged from previous on 09/14/2020.  Troponin negative with consistent chest pain for greater than 36 hours.  Highly doubt ACS at this  time.  No  tachycardia and no risk factors for DVT/PE.  Highly doubt pulmonary embolus.  Suspect chest discomfort is secondary to URI/viral process.  Will give albuterol and reassess.  Patient does not need a repeat troponin at this time.  1:57 AM Pt with improvement in chest tightness after albuterol.  Pt will have close follow-up with PCP for further evaluation.  Discussed reasons to return to the emergency department.  Patient states understanding and is in agreement with the plan.   Final Clinical Impression(s) / ED Diagnoses Final diagnoses:  Atypical chest pain  Viral upper respiratory tract infection    Rx / DC Orders ED Discharge Orders          Ordered    fluticasone (FLONASE) 50 MCG/ACT nasal spray  Daily        02/28/21 0155    bacitracin ointment  2 times daily        02/28/21 0155             Brooklyn Jeff, Gwenlyn Perking 02/28/21 0158    Fredia Sorrow, MD 02/28/21 2017

## 2021-02-28 LAB — SARS CORONAVIRUS 2 (TAT 6-24 HRS): SARS Coronavirus 2: NEGATIVE

## 2021-02-28 MED ORDER — FLUTICASONE PROPIONATE 50 MCG/ACT NA SUSP: 2.0000 | Freq: Every day | NASAL | 0 refills | Status: AC

## 2021-02-28 MED ORDER — BACITRACIN ZINC 500 UNIT/GM EX OINT: 1.0000 "application " | TOPICAL_OINTMENT | Freq: Two times a day (BID) | CUTANEOUS | 0 refills | Status: AC

## 2021-02-28 NOTE — Discharge Instructions (Signed)
1. Medications: flonase for nasal congestion; bacitracin for pimple in ear, albuterol for chest tightness, usual home medications 2. Treatment: rest, drink plenty of fluids,  3. Follow Up: Please followup with your primary doctor in 1-2 days for discussion of your diagnoses and further evaluation after today's visit; if you do not have a primary care doctor use the resource guide provided to find one; Please return to the ER for new or worsening symptoms

## 2021-06-18 ENCOUNTER — Other Ambulatory Visit: Payer: Self-pay | Admitting: Family Medicine

## 2021-06-18 ENCOUNTER — Other Ambulatory Visit: Payer: Self-pay

## 2021-06-18 ENCOUNTER — Ambulatory Visit
Admission: RE | Admit: 2021-06-18 | Discharge: 2021-06-18 | Disposition: A | Discharge status: Home / Self Care | Payer: BC Managed Care – PPO | Source: Ambulatory Visit | Attending: Family Medicine | Admitting: Family Medicine

## 2021-06-18 DIAGNOSIS — R921 Mammographic calcification found on diagnostic imaging of breast: Secondary | ICD-10-CM

## 2021-08-14 ENCOUNTER — Emergency Department (HOSPITAL_BASED_OUTPATIENT_CLINIC_OR_DEPARTMENT_OTHER)
Admission: EM | Admit: 2021-08-14 | Discharge: 2021-08-14 | Disposition: A | Discharge status: Home / Self Care | Payer: BC Managed Care – PPO | Attending: Emergency Medicine | Admitting: Emergency Medicine

## 2021-08-14 ENCOUNTER — Encounter (HOSPITAL_BASED_OUTPATIENT_CLINIC_OR_DEPARTMENT_OTHER): Payer: Self-pay

## 2021-08-14 ENCOUNTER — Emergency Department (HOSPITAL_BASED_OUTPATIENT_CLINIC_OR_DEPARTMENT_OTHER): Payer: BC Managed Care – PPO

## 2021-08-14 ENCOUNTER — Other Ambulatory Visit: Payer: Self-pay

## 2021-08-14 DIAGNOSIS — Y92009 Unspecified place in unspecified non-institutional (private) residence as the place of occurrence of the external cause: Secondary | ICD-10-CM | POA: Insufficient documentation

## 2021-08-14 DIAGNOSIS — W208XXA Other cause of strike by thrown, projected or falling object, initial encounter: Secondary | ICD-10-CM | POA: Insufficient documentation

## 2021-08-14 DIAGNOSIS — S0990XA Unspecified injury of head, initial encounter: Secondary | ICD-10-CM | POA: Diagnosis present

## 2021-08-14 NOTE — ED Triage Notes (Signed)
Patient here POV from Home with Head Injury.  On Sunday at approximately 1300 a Vase fell on her Head. Headache since.   No LOC. No Photosensitivity. No Nausea. No Neck Pain.  NAD Noted during Triage. A&Ox4. GCS 15.

## 2021-08-14 NOTE — ED Provider Notes (Signed)
[facility] EMERGENCY DEPT Provider Note   CSN: [id number] Arrival date & time: 08/14/21  1841     History  Chief Complaint  Patient presents with   Head Injury    Julia Mayer is a 31 y.o. female.  31 year old female presents with headache on left side of her head after a vase fell on it several days ago.  Denies any loss of consciousness.  Does not take any blood thinners.  Has had no confusion or emesis.  Pain is characterized as dull and relieved with ibuprofen.  No neurological abnormalities noted per patient      Home Medications Prior to Admission medications   Medication Sig Start Date End Date Taking? Authorizing Provider  amLODipine (NORVASC) 5 MG tablet Take by mouth. 09/08/20   [provider]  bacitracin ointment Apply 1 application topically 2 (two) times daily. 02/28/21   Muthersbaugh, Jarrett Soho, PA-C  clotrimazole (LOTRIMIN) 1 % cream Apply 1 application topically 2 (two) times daily. Patient not taking: Reported on 02/27/2021 11/13/20   Shelly Bombard, MD  fluticasone [facility]) 50 MCG/ACT nasal spray Place 2 sprays into both nostrils daily. 02/28/21   Muthersbaugh, Jarrett Soho, PA-C  metroNIDAZOLE (FLAGYL) 500 MG tablet Take 1 tablet (500 mg total) by mouth 2 (two) times daily. Patient not taking: Reported on 02/27/2021 11/14/20   Shelly Bombard, MD  ondansetron (ZOFRAN ODT) 8 MG disintegrating tablet Take 1 tablet (8 mg total) by mouth every 8 (eight) hours as needed for nausea or vomiting. Patient not taking: No sig reported 10/24/20   Molpus, Jenny Reichmann, MD  oxyCODONE-acetaminophen (PERCOCET) 5-325 MG tablet Take 1 tablet by mouth every 6 (six) hours as needed for severe pain. Patient not taking: No sig reported 10/24/20   Molpus, Jenny Reichmann, MD      Allergies    Bactrim [sulfamethoxazole-trimethoprim]    Review of Systems   Review of Systems  All other systems reviewed and are negative.  Physical Exam Updated Vital Signs BP (!) 150/96    Pulse 63     Temp 97.8 F (36.6 C)    Resp 16    Ht 1.575 m (5\' 2" )    Wt 83 kg    SpO2 100%    BMI 33.47 kg/m  Physical Exam Vitals and nursing note reviewed.  Constitutional:      General: She is not in acute distress.    Appearance: Normal appearance. She is well-developed. She is not toxic-appearing.  HENT:     Head: Normocephalic and atraumatic.  Eyes:     General: Lids are normal.     Conjunctiva/sclera: Conjunctivae normal.     Pupils: Pupils are equal, round, and reactive to light.  Neck:     Thyroid: No thyroid mass.     Trachea: No tracheal deviation.  Cardiovascular:     Rate and Rhythm: Normal rate and regular rhythm.     Heart sounds: Normal heart sounds. No murmur heard.   No gallop.  Pulmonary:     Effort: Pulmonary effort is normal. No respiratory distress.     Breath sounds: Normal breath sounds. No stridor. No decreased breath sounds, wheezing, rhonchi or rales.  Abdominal:     General: There is no distension.     Palpations: Abdomen is soft.     Tenderness: There is no abdominal tenderness. There is no rebound.  Musculoskeletal:        General: No tenderness. Normal range of motion.     Cervical back: Normal range  of motion and neck supple.  Skin:    General: Skin is warm and dry.     Findings: No abrasion or rash.  Neurological:     General: No focal deficit present.     Mental Status: She is alert and oriented to person, place, and time. Mental status is at baseline.     GCS: GCS eye subscore is 4. GCS verbal subscore is 5. GCS motor subscore is 6.     Cranial Nerves: No cranial nerve deficit.     Sensory: No sensory deficit.     Motor: Motor function is intact.  Psychiatric:        Attention and Perception: Attention normal.        Speech: Speech normal.        Behavior: Behavior normal.    ED Results / Procedures / Treatments   Labs (all labs ordered are listed, but only abnormal results are displayed) Labs Reviewed - No data to  display  EKG None  Radiology No results found.  Procedures Procedures    Medications Ordered in ED Medications - No data to display  ED Course/ Medical Decision Making/ A&P                           Medical Decision Making Amount and/or Complexity of Data Reviewed Radiology: ordered.   Patient presents after heavy head injury several days ago.  Head CT negative.  Neurological exam is stable.  Suspect concussion and will discharge        Final Clinical Impression(s) / ED Diagnoses Final diagnoses:  None    Rx / DC Orders ED Discharge Orders     None         Lacretia Leigh, MD 08/14/21 2140

## 2021-08-14 NOTE — ED Notes (Signed)
EMT-P provided AVS using Teachback Method. Patient verbalizes understanding of Discharge Instructions. Opportunity for Questioning and Answers were provided by EMT-P. Patient Discharged from ED.  ? ?

## 2021-09-30 IMAGING — MG DIGITAL DIAGNOSTIC BILAT W/ TOMO W/ CAD
8 of 16 series · 8 of 40 positions shown · non-contrast
Comparison: Previous exam(s).

CLINICAL DATA: 30-year-old female presenting with bilateral
intermittent nipple itching and pain for approximately 1 month. No
nipple discharge.

EXAM:
DIGITAL DIAGNOSTIC BILATERAL MAMMOGRAM WITH TOMOSYNTHESIS AND CAD;
ULTRASOUND LEFT BREAST LIMITED; ULTRASOUND RIGHT BREAST LIMITED
TECHNIQUE: Bilateral digital diagnostic mammography and breast tomosynthesis
was performed. The images were evaluated with computer-aided
detection.; Targeted ultrasound examination of the left breast was
performed; Targeted ultrasound examination of the right breast was
performed

[L ML (1 of 3)]
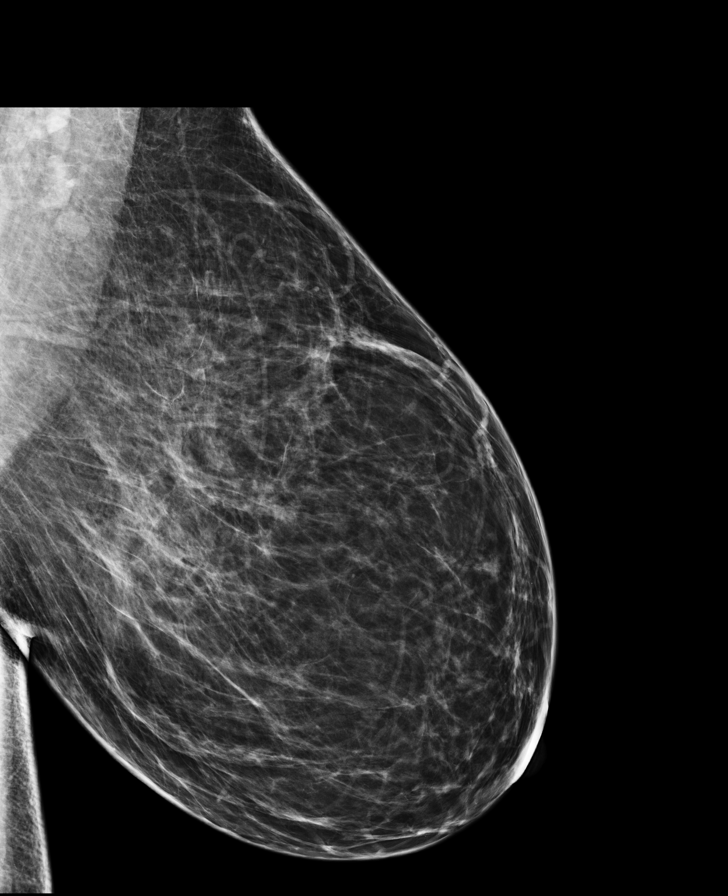

[L ML (2 of 3)]
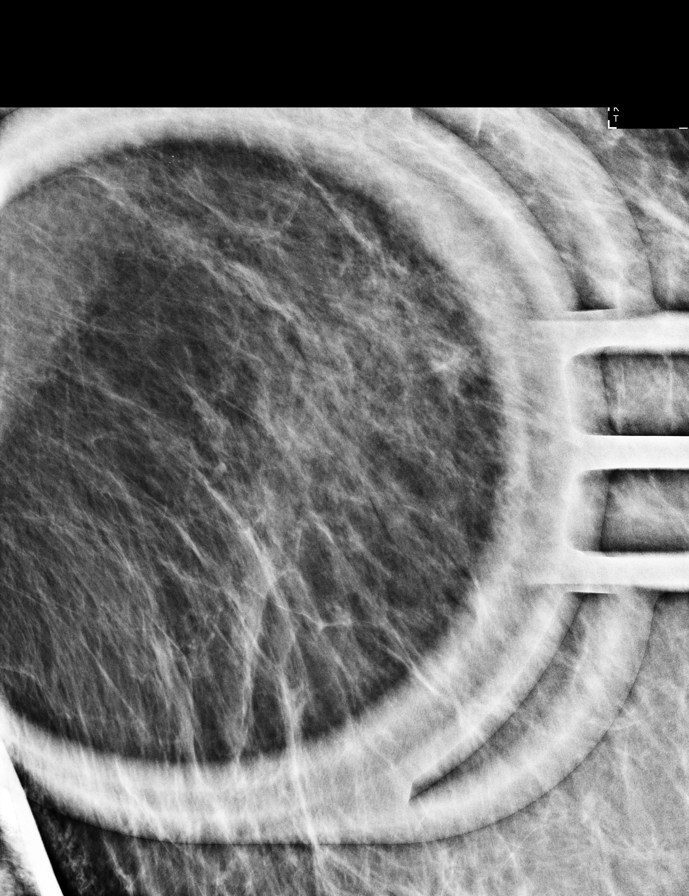

[L CC]
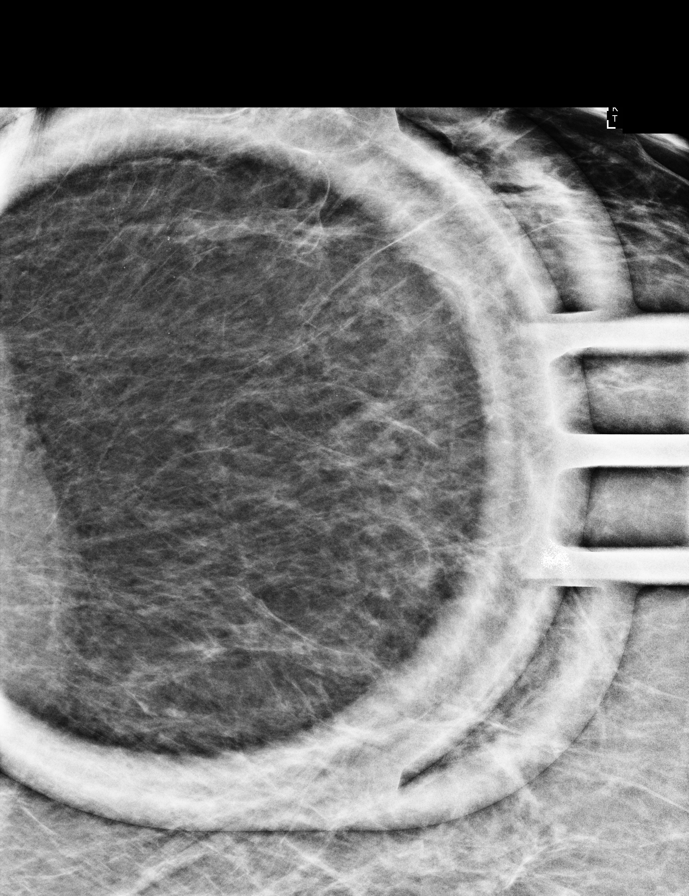

[L ML (3 of 3)]
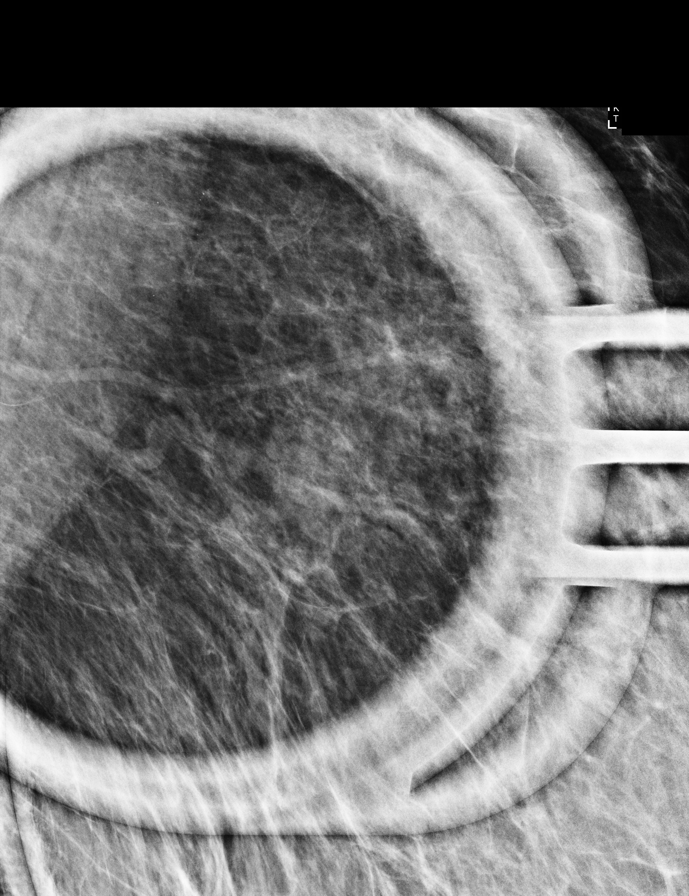

[L MLO synth-2D]
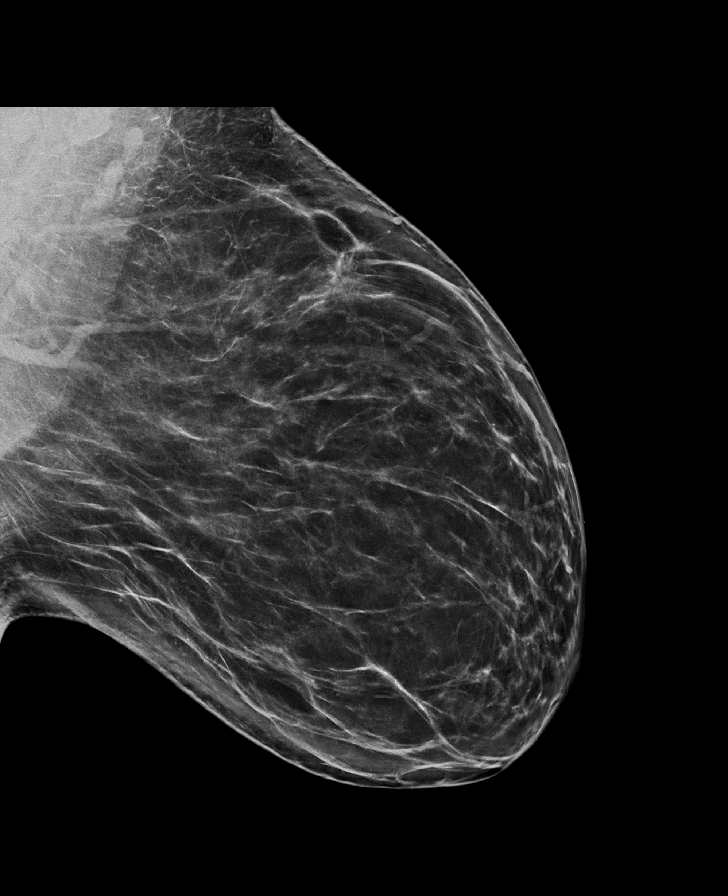

[L CC synth-2D]
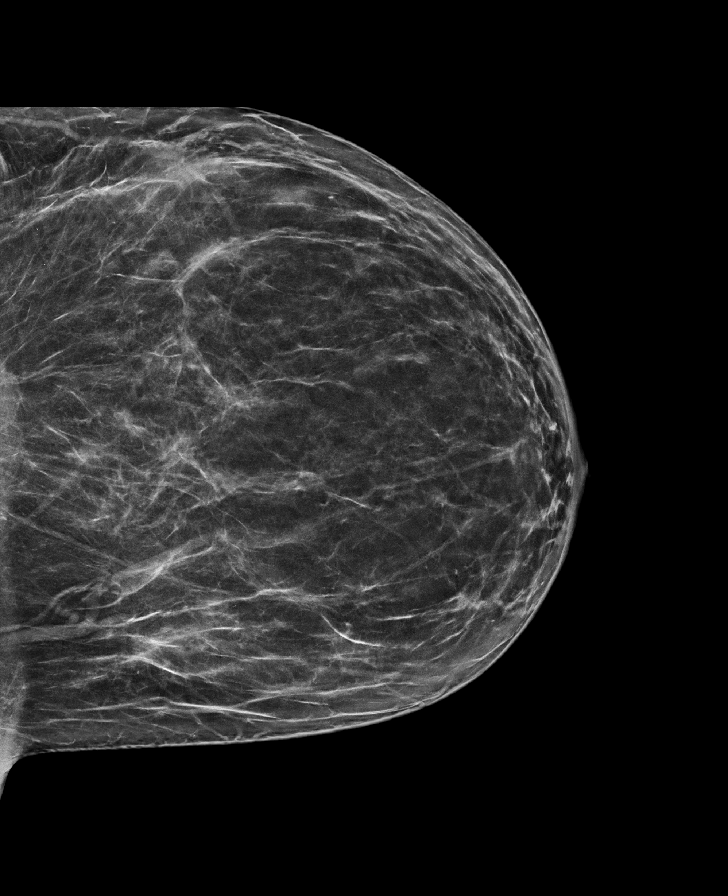

[R MLO synth-2D]
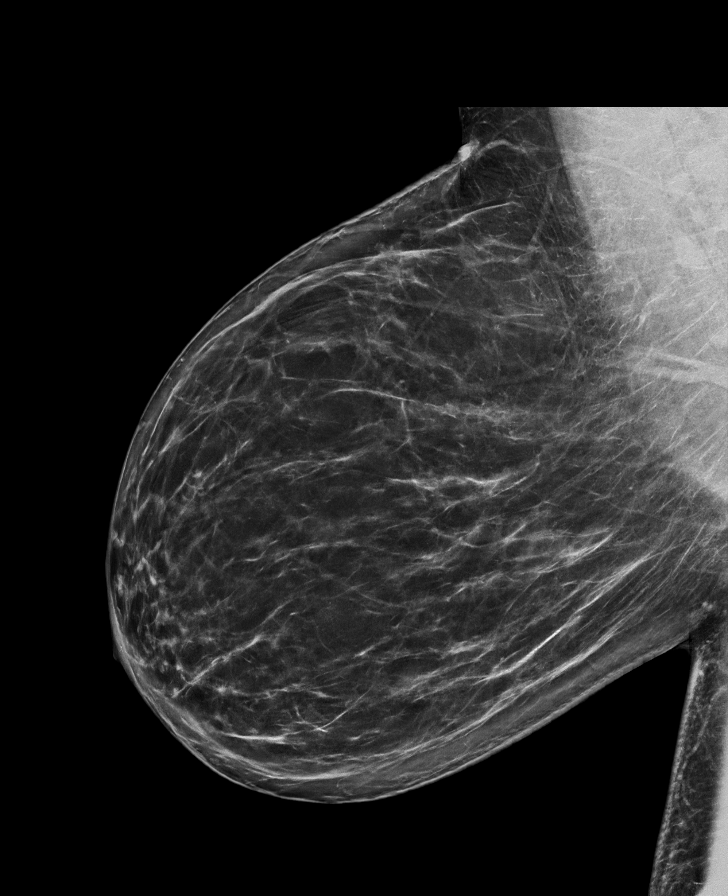

[R CC synth-2D]
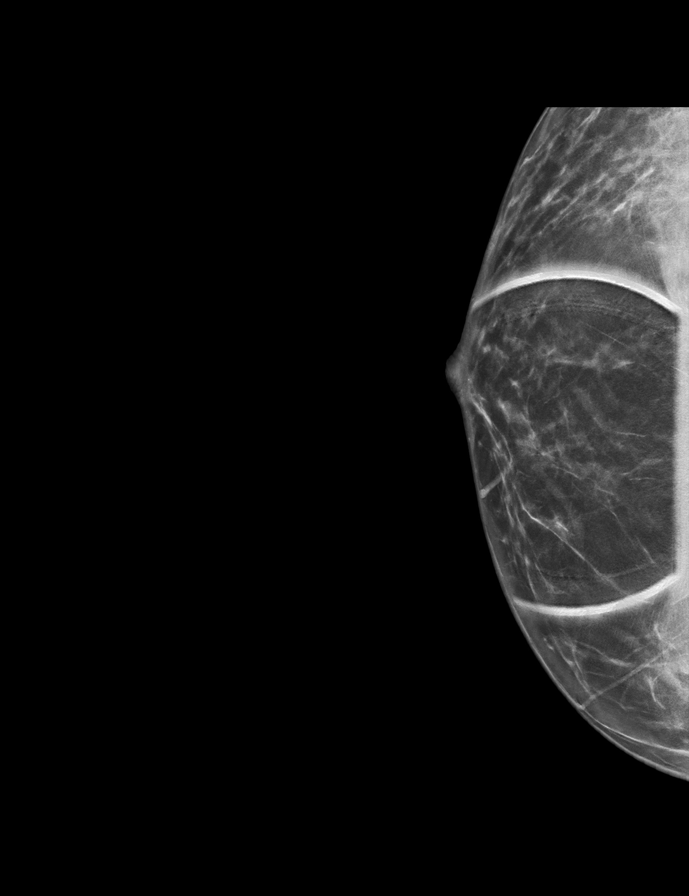

[8 of 40 positions shown; findings below may reference images not displayed]

ACR Breast Density Category b: There are scattered areas of
fibroglandular density.
FINDINGS: Mammogram:

Right breast: A retroareolar spot compression tomosynthesis view was
performed in addition to standard views. There is no suspicious
finding in the retroareolar right breast or elsewhere to suggest the
presence of malignancy.

Left breast: A retroareolar spot compression tomosynthesis view was
performed demonstrating no suspicious abnormality. Spot 2D
magnification views were performed for calcifications in the outer
left breast. On the spot imaging there is persistence of loosely
grouped regional punctate calcifications. No suspicious linear or
branching forms. No associated mass or asymmetry. There are no
additional findings elsewhere in the left breast.

On physical exam of the bilateral breasts, the nipples and areolas
are normal in appearance without erythema or rash.

Ultrasound:

Targeted ultrasound performed in the bilateral retroareolar aspect
of the breast demonstrating no cystic or solid mass. No dilated duct
or fluid collection.
IMPRESSION: 1. No mammographic or sonographic finding to explain the patient's
bilateral nipple itching and pain.

2.  Probably benign calcifications in the outer left breast.

RECOMMENDATION:
1.  Clinical follow-up as needed for the bilateral nipple itching.

2. Diagnostic left breast mammogram in 6 months for surveillance of
the probably benign left breast calcifications.

I have discussed the findings and recommendations with the patient.
If applicable, a reminder letter will be sent to the patient
regarding the next appointment.

BI-RADS CATEGORY  3: Probably benign.

## 2021-11-06 ENCOUNTER — Other Ambulatory Visit: Payer: Self-pay | Admitting: Obstetrics

## 2021-11-06 DIAGNOSIS — B369 Superficial mycosis, unspecified: Secondary | ICD-10-CM

## 2021-11-20 ENCOUNTER — Ambulatory Visit (INDEPENDENT_AMBULATORY_CARE_PROVIDER_SITE_OTHER): Payer: BC Managed Care – PPO | Admitting: Obstetrics

## 2021-11-20 ENCOUNTER — Encounter: Payer: Self-pay | Admitting: Obstetrics

## 2021-11-20 ENCOUNTER — Other Ambulatory Visit (HOSPITAL_COMMUNITY)
Admission: RE | Admit: 2021-11-20 | Discharge: 2021-11-20 | Disposition: A | Discharge status: Home / Self Care | Payer: BC Managed Care – PPO | Source: Ambulatory Visit | Attending: Obstetrics | Admitting: Obstetrics

## 2021-11-20 VITALS — BP 141/96 | HR 84 | Ht 62.0 in | Wt 166.0 lb

## 2021-11-20 DIAGNOSIS — Z01419 Encounter for gynecological examination (general) (routine) without abnormal findings: Secondary | ICD-10-CM | POA: Diagnosis not present

## 2021-11-20 DIAGNOSIS — B3731 Acute candidiasis of vulva and vagina: Secondary | ICD-10-CM

## 2021-11-20 DIAGNOSIS — Z113 Encounter for screening for infections with a predominantly sexual mode of transmission: Secondary | ICD-10-CM

## 2021-11-20 DIAGNOSIS — N898 Other specified noninflammatory disorders of vagina: Secondary | ICD-10-CM

## 2021-11-20 MED ORDER — FLUCONAZOLE 150 MG PO TABS
150.0000 mg | ORAL_TABLET | Freq: Once | ORAL | 0 refills | Status: AC
Start: 2021-11-20 — End: 2021-11-20

## 2021-11-20 NOTE — Progress Notes (Signed)
? ?Subjective: ? ? ?  ?  ? Julia Mayer is a 31 y.o. female here for a routine exam.  Current complaints: Vaginal discharge and itching.   ? ?Personal health questionnaire:  ?Is patient Ashkenazi Jewish, have a family history of breast and/or ovarian cancer: yes ?Is there a family history of uterine cancer diagnosed at age < 26, gastrointestinal cancer, urinary tract cancer, family member who is a Field seismologist syndrome-associated carrier: no ?Is the patient overweight and hypertensive, family history of diabetes, personal history of gestational diabetes, preeclampsia or PCOS: no ?Is patient over 31, have PCOS,  family history of premature CHD under age 24, diabetes, smoke, have hypertension or peripheral artery disease:  no ?At any time, has a partner hit, kicked or otherwise hurt or frightened you?: no ?Over the past 2 weeks, have you felt down, depressed or hopeless?: no ?Over the past 2 weeks, have you felt little interest or pleasure in doing things?:no ? ? ?Gynecologic History ?Patient's last menstrual period was 10/28/2021. ?Contraception: none ?Last Pap: 09-13-2020. Results were: normal ?Last mammogram: 06-18-2021. Results were: normal ? ?Obstetric History ?OB History  ?Gravida Para Term Preterm AB Living  ?'5 2 2 '$ 0 3 2  ?SAB IAB Ectopic Multiple Live Births  ?0 3 0 0 2  ?  ?# Outcome Date GA Lbr Len/2nd Weight Sex Delivery Anes PTL Lv  ?5 Term 07/25/16 28w0d12:23 / 00:17 7 lb 12.2 oz (3.52 kg) F Vag-Spont EPI  LIV  ?4 Term 07/20/11 450w1d75:02 / 00:27  F Vag-Spont   LIV  ?3 IAB           ?2 IAB           ?1 IAB           ? ? ?Past Medical History:  ?Diagnosis Date  ? Chlamydia   ? Gallstones 06/19/2018  ? Hypertension   ? UTI (urinary tract infection)   ?  ?Past Surgical History:  ?Procedure Laterality Date  ? CHOLECYSTECTOMY N/A 06/19/2018  ? Procedure: LAPAROSCOPIC CHOLECYSTECTOMY WITH INTRAOPERATIVE CHOLANGIOGRAM ERAS PATHWAY;  Surgeon: InFanny SkatesMD;  [facility] [facility] Service: General;  Laterality: N/A;   ? ESOPHAGOGASTRODUODENOSCOPY N/A 06/11/2018  ? Procedure: ESOPHAGOGASTRODUODENOSCOPY (EGD);  Surgeon: JaMilus BanisterMD;  [facility];  Service: Endoscopy;  Laterality: N/A;  ? EUS N/A 06/11/2018  ? Procedure: UPPER ENDOSCOPIC ULTRASOUND (EUS) RADIAL;  Surgeon: JaMilus BanisterMD;  Location: [facility]OSCOPY;  Service: Endoscopy;  Laterality: N/A;  ? EYE SURGERY    ? 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   ? FINE NEEDLE ASPIRATION N/A 06/11/2018  ? Procedure: FINE NEEDLE ASPIRATION (FNA) LINEAR;  Surgeon: JaMilus BanisterMD;  Location: [facility] ENDOSCOPY;  Service: Endoscopy;  Laterality: N/A;  ? INDUCED ABORTION    ?  ? ?Current Outpatient Medications:  ?  amLODipine (NORVASC) 5 MG tablet, Take by mouth., Disp: , Rfl:  ?  bacitracin ointment, Apply 1 application topically 2 (two) times daily., Disp: 120 g, Rfl: 0 ?  fluticasone (FLONASE) 50 MCG/ACT nasal spray, Place 2 sprays into both nostrils daily., Disp: 9.9 g, Rfl: 0 ?  clotrimazole (LOTRIMIN) 1 % cream, APPLY TO AFFECTED AREA TWICE A DAY (Patient not taking: Reported on 11/20/2021), Disp: 60 g, Rfl: 2 ?  metroNIDAZOLE (FLAGYL) 500 MG tablet, Take 1 tablet (500 mg total) by mouth 2 (two) times daily., Disp: 14 tablet, Rfl: 2 ?  ondansetron (ZOFRAN ODT) 8 MG disintegrating tablet, Take 1 tablet (  8 mg total) by mouth every 8 (eight) hours as needed for nausea or vomiting., Disp: 10 tablet, Rfl: 0 ?  oxyCODONE-acetaminophen (PERCOCET) 5-325 MG tablet, Take 1 tablet by mouth every 6 (six) hours as needed for severe pain., Disp: 12 tablet, Rfl: 0 ?Allergies  ?Allergen Reactions  ? Bactrim [Sulfamethoxazole-Trimethoprim] Hives  ?  ?Social History  ? ?Tobacco Use  ? Smoking status: Never  ? Smokeless tobacco: Never  ?Substance Use Topics  ? Alcohol use: Yes  ?  Comment: occasionally  ?  ?Family History  ?Problem Relation Age of Onset  ? Hypertension Mother   ? Asthma Mother   ? Alcohol abuse Mother   ? Hypertension Father   ? Hyperlipidemia Father   ? Breast cancer Maternal  Grandmother   ?     23s  ? Cancer Paternal Grandmother   ? Anesthesia problems Paternal Grandmother   ?  ? ? ?Review of Systems ? ?Constitutional: negative for fatigue and weight loss ?Respiratory: negative for cough and wheezing ?Cardiovascular: negative for chest pain, fatigue and palpitations ?Gastrointestinal: negative for abdominal pain and change in bowel habits ?Musculoskeletal:negative for myalgias ?Neurological: negative for gait problems and tremors ?Behavioral/Psych: negative for abusive relationship, depression ?Endocrine: negative for temperature intolerance    ?Genitourinary: positive for vaginal discharge with itching.  negative for abnormal menstrual periods, genital lesions, hot flashes, sexual problems  ?Integument/breast: negative for breast lump, breast tenderness, nipple discharge and skin lesion(s) ? ?  ?Objective:  ? ?    ?BP (!) 141/96   Pulse 84   Ht '5\' 2"'$  (1.575 m)   Wt 166 lb (75.3 kg)   LMP 10/28/2021   BMI 30.36 kg/m?  ?General:   Alert and no distress  ?Skin:   no rash or abnormalities  ?Lungs:   clear to auscultation bilaterally  ?Heart:   regular rate and rhythm, S1, S2 normal, no murmur, click, rub or gallop  ?Breasts:   normal without suspicious masses, skin or nipple changes or axillary nodes  ?Abdomen:  normal findings: no organomegaly, soft, non-tender and no hernia  ?Pelvis:  External genitalia: normal general appearance ?Urinary system: urethral meatus normal and bladder without fullness, nontender ?Vaginal: normal without tenderness, induration or masses ?Cervix: normal appearance ?Adnexa: normal bimanual exam ?Uterus: anteverted and non-tender, normal size  ? ?Lab Review ?Urine pregnancy test ?Labs reviewed yes ?Radiologic studies reviewed yes ? ?I have spent a total of 20 minutes of face-to-face time, excluding clinical staff time, reviewing notes and preparing to see patient, ordering tests and/or medications, and counseling the patient.  ? ? ?Assessment:  ? ? 1.  Encounter for gynecological examination with Papanicolaou smear of cervix ?Rx: ?- Cytology - PAP( [place]) ? ?2. Vaginal discharge ?Rx: ?- Cervicovaginal ancillary only( [place]) ? ?3. Screening for STD (sexually transmitted disease) ?Rx: ?- Hepatitis B surface antigen ?- Hepatitis C antibody ?- HIV Antibody (routine testing w rflx) ?- RPR ? ?4. Candida vaginitis from antibiotic treatment for toe-nail infection ?Rx: ?- fluconazole (DIFLUCAN) 150 MG tablet; Take 1 tablet (150 mg total) by mouth once for 1 dose.  Dispense: 1 tablet; Refill: 0   ? ? ?Plan:  ? ? Education reviewed: calcium supplements, depression evaluation, low fat, low cholesterol diet, safe sex/STD prevention, self breast exams, and weight bearing exercise. ?Contraception: none. ?Follow up in: 1 year.  ? ?No orders of the defined types were placed in this encounter. ? ?Orders Placed This Encounter  ?Procedures  ? Hepatitis B surface antigen  ?  Hepatitis C antibody  ? HIV Antibody (routine testing w rflx)  ? RPR  ? ? ? ? Shelly Bombard, MD ?11/20/2021 9:01 AM  ?

## 2021-11-20 NOTE — Progress Notes (Signed)
Patient presents for AEX. Patient states that she has been some vaginal itching. Denies discharge. Patient request to be checked for STD, yeast, and bv. No other concerns.  ? ?Last pap: 11/13/20 HRHPV ?

## 2021-11-21 ENCOUNTER — Other Ambulatory Visit: Payer: Self-pay | Admitting: Obstetrics

## 2021-11-21 DIAGNOSIS — B3731 Acute candidiasis of vulva and vagina: Secondary | ICD-10-CM

## 2021-11-21 LAB — CERVICOVAGINAL ANCILLARY ONLY
Bacterial Vaginitis (gardnerella): NEGATIVE
Candida Glabrata: NEGATIVE
Candida Vaginitis: POSITIVE — AB
Chlamydia: NEGATIVE
Comment: NEGATIVE
Comment: NEGATIVE
Comment: NEGATIVE
Comment: NEGATIVE
Comment: NEGATIVE
Comment: NORMAL
Neisseria Gonorrhea: NEGATIVE
Trichomonas: NEGATIVE

## 2021-11-21 LAB — HEPATITIS B SURFACE ANTIGEN: Hepatitis B Surface Ag: NEGATIVE

## 2021-11-21 LAB — HEPATITIS C ANTIBODY: Hep C Virus Ab: NONREACTIVE

## 2021-11-21 LAB — RPR: RPR Ser Ql: NONREACTIVE

## 2021-11-21 LAB — HIV ANTIBODY (ROUTINE TESTING W REFLEX): HIV Screen 4th Generation wRfx: NONREACTIVE

## 2021-11-21 MED ORDER — FLUCONAZOLE 200 MG PO TABS: 200.0000 mg | ORAL_TABLET | ORAL | 2 refills | Status: AC

## 2021-11-22 LAB — CYTOLOGY - PAP
Diagnosis: NEGATIVE
Diagnosis: REACTIVE

## 2021-11-22 NOTE — Progress Notes (Signed)
TC to notify pt of yeast infection and RX sent. No answer. HIPPA compliant VM left. MyChart message including education sent.

## 2022-01-11 ENCOUNTER — Ambulatory Visit
Admission: RE | Admit: 2022-01-11 | Discharge: 2022-01-11 | Disposition: A | Discharge status: Home / Self Care | Payer: BC Managed Care – PPO | Source: Ambulatory Visit | Attending: Family Medicine | Admitting: Family Medicine

## 2022-01-11 DIAGNOSIS — R921 Mammographic calcification found on diagnostic imaging of breast: Secondary | ICD-10-CM

## 2022-04-28 ENCOUNTER — Encounter (HOSPITAL_BASED_OUTPATIENT_CLINIC_OR_DEPARTMENT_OTHER): Payer: Self-pay | Admitting: Emergency Medicine

## 2022-04-28 ENCOUNTER — Emergency Department (HOSPITAL_BASED_OUTPATIENT_CLINIC_OR_DEPARTMENT_OTHER)
Admission: EM | Admit: 2022-04-28 | Discharge: 2022-04-28 | Disposition: A | Discharge status: Home / Self Care | Payer: BC Managed Care – PPO | Attending: Emergency Medicine | Admitting: Emergency Medicine

## 2022-04-28 ENCOUNTER — Other Ambulatory Visit: Payer: Self-pay

## 2022-04-28 DIAGNOSIS — R109 Unspecified abdominal pain: Secondary | ICD-10-CM | POA: Diagnosis present

## 2022-04-28 DIAGNOSIS — I10 Essential (primary) hypertension: Secondary | ICD-10-CM | POA: Diagnosis not present

## 2022-04-28 DIAGNOSIS — R1084 Generalized abdominal pain: Secondary | ICD-10-CM | POA: Insufficient documentation

## 2022-04-28 DIAGNOSIS — R519 Headache, unspecified: Secondary | ICD-10-CM | POA: Diagnosis not present

## 2022-04-28 DIAGNOSIS — Z79899 Other long term (current) drug therapy: Secondary | ICD-10-CM | POA: Diagnosis not present

## 2022-04-28 LAB — COMPREHENSIVE METABOLIC PANEL
ALT: 8 U/L (ref 0–44)
AST: 9 U/L — ABNORMAL LOW (ref 15–41)
Albumin: 4 g/dL (ref 3.5–5.0)
Alkaline Phosphatase: 63 U/L (ref 38–126)
Anion gap: 8 (ref 5–15)
BUN: 11 mg/dL (ref 6–20)
CO2: 26 mmol/L (ref 22–32)
Calcium: 9.1 mg/dL (ref 8.9–10.3)
Chloride: 106 mmol/L (ref 98–111)
Creatinine, Ser: 0.65 mg/dL (ref 0.44–1.00)
GFR, Estimated: >60 mL/min (ref >60)
Glucose, Bld: 110 mg/dL — ABNORMAL HIGH (ref 70–99)
Potassium: 3.5 mmol/L (ref 3.5–5.1)
Sodium: 140 mmol/L (ref 135–145)
Total Bilirubin: 0.3 mg/dL (ref 0.3–1.2)
Total Protein: 6.5 g/dL (ref 6.5–8.1)

## 2022-04-28 LAB — URINALYSIS, ROUTINE W REFLEX MICROSCOPIC
Bilirubin Urine: NEGATIVE
Glucose, UA: NEGATIVE mg/dL
Hgb urine dipstick: NEGATIVE
Ketones, ur: NEGATIVE mg/dL
Leukocytes,Ua: NEGATIVE
Nitrite: NEGATIVE
Specific Gravity, Urine: 1.03 (ref 1.005–1.030)
pH: 6 (ref 5.0–8.0)

## 2022-04-28 LAB — CBC
HCT: 37 % (ref 36.0–46.0)
Hemoglobin: 11.9 g/dL — ABNORMAL LOW (ref 12.0–15.0)
MCH: 29 pg (ref 26.0–34.0)
MCHC: 32.2 g/dL (ref 30.0–36.0)
MCV: 90 fL (ref 80.0–100.0)
Platelets: 273 10*3/uL (ref 150–400)
RBC: 4.11 MIL/uL (ref 3.87–5.11)
RDW: 12.8 % (ref 11.5–15.5)
WBC: 6.4 10*3/uL (ref 4.0–10.5)
nRBC: 0 % (ref 0.0–0.2)

## 2022-04-28 LAB — LIPASE, BLOOD: Lipase: 20 U/L (ref 11–51)

## 2022-04-28 LAB — PREGNANCY, URINE: Preg Test, Ur: NEGATIVE

## 2022-04-28 MED ORDER — ACETAMINOPHEN 325 MG PO TABS
650.0000 mg | ORAL_TABLET | Freq: Once | ORAL | Status: AC
  Administered 2022-04-28: 650 mg via ORAL
  Filled 2022-04-28: qty 2

## 2022-04-28 NOTE — ED Provider Notes (Signed)
[facility] EMERGENCY DEPT Provider Note   CSN: [id number] Arrival date & time: 04/28/22  1416     History  Chief Complaint  Patient presents with   Abdominal Pain   Headache    Julia Mayer is a 31 y.o. female.  Patient is a 31 year old female with a past medical history of hypertension presenting to the emergency department with abdominal pain.  The patient reports for the last 5 days she has been having pain on her bilateral flanks that will intermittently spread diffusely across her abdomen.  She states that it feels like a dull sort of ache.  She states that she also feels gassy.  She denies any nausea or vomiting.  She states that she had some loose stools but denies any black or bloody stools.  She denies any dysuria or hematuria or abnormal normal vaginal discharge.  She denies any concern for STDs.  She states that she was due for her period 3 days ago and it has not yet started.  She denies any fevers or chills.  She initially reported headache to triage but is no longer complaining of this to me.  The history is provided by the patient.  Abdominal Pain Headache Associated symptoms: abdominal pain        Home Medications Prior to Admission medications   Medication Sig Start Date End Date Taking? Authorizing Provider  amLODipine (NORVASC) 5 MG tablet Take by mouth. 09/08/20   [provider]  bacitracin ointment Apply 1 application topically 2 (two) times daily. 02/28/21   Muthersbaugh, Jarrett Soho, PA-C  clotrimazole (LOTRIMIN) 1 % cream APPLY TO AFFECTED AREA TWICE A DAY Patient not taking: Reported on 11/20/2021 11/07/21   Shelly Bombard, MD  fluconazole (DIFLUCAN) 200 MG tablet Take 1 tablet (200 mg total) by mouth every 3 (three) days. 11/21/21   Shelly Bombard, MD  fluticasone (FLONASE) 50 MCG/ACT nasal spray Place 2 sprays into both nostrils daily. 02/28/21   Muthersbaugh, Jarrett Soho, PA-C  metroNIDAZOLE (FLAGYL) 500 MG tablet Take 1 tablet (500 mg  total) by mouth 2 (two) times daily. 11/14/20   Shelly Bombard, MD  ondansetron (ZOFRAN ODT) 8 MG disintegrating tablet Take 1 tablet (8 mg total) by mouth every 8 (eight) hours as needed for nausea or vomiting. 10/24/20   Molpus, John, MD  oxyCODONE-acetaminophen (PERCOCET) 5-325 MG tablet Take 1 tablet by mouth every 6 (six) hours as needed for severe pain. 10/24/20   Molpus, Jenny Reichmann, MD      Allergies    Bactrim [sulfamethoxazole-trimethoprim]    Review of Systems   Review of Systems  Gastrointestinal:  Positive for abdominal pain.  Neurological:  Positive for headaches.    Physical Exam Updated Vital Signs BP (!) 132/94   Pulse (!) 58   Temp 98.4 F (36.9 C) (Oral)   Resp 18   Ht '5\' 2"'$  (1.575 m)   Wt 71.7 kg   LMP 03/31/2022 (Exact Date)   SpO2 100%   BMI 28.90 kg/m  Physical Exam Vitals and nursing note reviewed.  Constitutional:      General: She is not in acute distress.    Appearance: She is well-developed. She is obese.  HENT:     Head: Normocephalic and atraumatic.     Mouth/Throat:     Mouth: Mucous membranes are moist.     Pharynx: Oropharynx is clear.  Eyes:     Extraocular Movements: Extraocular movements intact.  Cardiovascular:     Rate and Rhythm: Normal  rate and regular rhythm.     Heart sounds: Normal heart sounds.  Pulmonary:     Effort: Pulmonary effort is normal.     Breath sounds: Normal breath sounds.  Abdominal:     General: Abdomen is flat.     Palpations: Abdomen is soft.     Tenderness: There is no abdominal tenderness. There is no right CVA tenderness, left CVA tenderness, guarding or rebound.  Skin:    General: Skin is warm and dry.  Neurological:     General: No focal deficit present.     Mental Status: She is alert and oriented to person, place, and time.  Psychiatric:        Mood and Affect: Mood normal.        Behavior: Behavior normal.     ED Results / Procedures / Treatments   Labs (all labs ordered are listed, but only  abnormal results are displayed) Labs Reviewed  COMPREHENSIVE METABOLIC PANEL - Abnormal; Notable for the following components:      Result Value   Glucose, Bld 110 (*)    AST 9 (*)    All other components within normal limits  CBC - Abnormal; Notable for the following components:   Hemoglobin 11.9 (*)    All other components within normal limits  URINALYSIS, ROUTINE W REFLEX MICROSCOPIC - Abnormal; Notable for the following components:   Protein, ur TRACE (*)    All other components within normal limits  LIPASE, BLOOD  PREGNANCY, URINE    EKG None  Radiology No results found.  Procedures Procedures    Medications Ordered in ED Medications  acetaminophen (TYLENOL) tablet 650 mg (has no administration in time range)    ED Course/ Medical Decision Making/ A&P                           Medical Decision Making This patient presents to the ED with chief complaint(s) of abdominal pain with pertinent past medical history of attention which further complicates the presenting complaint. The complaint involves an extensive differential diagnosis and also carries with it a high risk of complications and morbidity.    The differential diagnosis includes pregnancy, ectopic, UTI, PID or STI less likely as patient has had no abnormal discharge or new sexual partners, intra-abdominal infection unlikely as the patient has no fever and no point abdominal tenderness, nephrolithiasis or pyelonephritis less likely as pain is bilateral and she has no CVA tenderness, considering possible colitis or viral syndrome  Additional history obtained: Additional history obtained from N/A Records reviewed outpatient OBGYN records  ED Course and Reassessment: Patient was initially evaluated in triage and had labs and urine performed.  Labs and urine were within normal range including a negative urine pregnancy test.  The patient has no reproducible pain on exam making intra-abdominal abdominal infection  unlikely.  Patient is concerned that she could be pregnant and she was recommended to repeat a home pregnancy test in 1 week and that she can follow-up with OB/GYN due to her irregular menses.  Patient was given strict return precautions.  Independent labs interpretation:  The following labs were independently interpreted: Within normal range  Independent visualization of imaging: N/A  Consultation: - Consulted or discussed management/test interpretation w/ external professional: N/A  Consideration for admission or further workup: Patient has no emergent conditions requiring admission or further work-up at this time Social Determinants of health: N/A    Amount and/or Complexity of Data  Reviewed Labs: ordered.  Risk OTC drugs.          Final Clinical Impression(s) / ED Diagnoses Final diagnoses:  Generalized abdominal pain    Rx / DC Orders ED Discharge Orders     None         Kemper Durie, DO 04/28/22 1956

## 2022-04-28 NOTE — Discharge Instructions (Signed)
You were seen in the emergency department for your abdominal pain.  Your work-up here showed a negative pregnancy test, no signs of urinary tract infection or other abdominal infection and normal kidney and liver functions.  It is unclear what is causing your abdominal pain at this time but you can follow-up with your primary doctor or your OB/GYN to have your symptoms rechecked.  You can take Tylenol or try Gas-X for your pain.  If you are concerned about pregnancy and still have not had your cycle by next week you can take a home pregnancy test at home to see if you could be pregnant.  You should return to the emergency department if you have fevers, significantly worsening pain, repetitive vomiting or if you have any other new or concerning symptoms.

## 2022-04-28 NOTE — ED Notes (Signed)
Reviewed AVS/discharge instruction with patient. Time allotted for and all questions answered. Patient is agreeable for d/c and escorted to ed exit by staff.  

## 2022-04-28 NOTE — ED Triage Notes (Signed)
Pt via pov from home with abdominal pain x 5 days and headache x 2 days. Pt denies n/v; endorses diarrhea one day last week. Pt alert & oriented, nad noted.

## 2022-08-13 ENCOUNTER — Ambulatory Visit: Payer: BC Managed Care – PPO | Admitting: Obstetrics

## 2022-08-13 ENCOUNTER — Encounter: Payer: Self-pay | Admitting: Obstetrics

## 2022-08-15 NOTE — Progress Notes (Signed)
Appointment cancelled.  Shelly Bombard, MD 08/15/2022 11:03 AM

## 2022-08-15 NOTE — Progress Notes (Signed)
Appointment cancelled.  Shelly Bombard, MD 08/15/2022 11:06 AM

## 2022-12-30 ENCOUNTER — Other Ambulatory Visit: Payer: Self-pay | Admitting: Family Medicine

## 2022-12-30 DIAGNOSIS — R921 Mammographic calcification found on diagnostic imaging of breast: Secondary | ICD-10-CM

## 2023-01-02 ENCOUNTER — Other Ambulatory Visit (HOSPITAL_COMMUNITY): Admission: RE | Admit: 2023-01-02 | Payer: BC Managed Care – PPO | Source: Ambulatory Visit

## 2023-01-02 ENCOUNTER — Encounter: Payer: Self-pay | Admitting: Obstetrics

## 2023-01-02 ENCOUNTER — Ambulatory Visit (INDEPENDENT_AMBULATORY_CARE_PROVIDER_SITE_OTHER): Payer: BC Managed Care – PPO | Admitting: Obstetrics

## 2023-01-02 VITALS — BP 126/88 | HR 60 | Ht 62.0 in | Wt 173.6 lb

## 2023-01-02 DIAGNOSIS — Z01419 Encounter for gynecological examination (general) (routine) without abnormal findings: Secondary | ICD-10-CM

## 2023-01-02 DIAGNOSIS — N898 Other specified noninflammatory disorders of vagina: Secondary | ICD-10-CM

## 2023-01-02 DIAGNOSIS — Z113 Encounter for screening for infections with a predominantly sexual mode of transmission: Secondary | ICD-10-CM

## 2023-01-02 NOTE — Progress Notes (Signed)
Subjective:        Julia Mayer is a 32 y.o. female here for a routine exam.  Current complaints: Vaginal discharge.    Personal health questionnaire:  Is patient Ashkenazi Jewish, have a family history of breast and/or ovarian cancer: yue Is there a family history of uterine cancer diagnosed at age < no50, gastrointestinal cancer, urinary tract cancer, family member who is a Personnel officer syndrome-associated carrier: no Is the patient overweight and hypertensive, family history of diabetes, personal history of gestational diabetes, preeclampsia or PCOS:  Is patient over 97, have PCOS,  family history of premature CHD under age 55, diabetes, smoke, have hypertension or peripheral artery disease:  no At any time, has a partner hit, kicked or otherwise hurt or frightened you?: no Over the past 2 weeks, have you felt down, depressed or hopeless?: no Over the past 2 weeks, have you felt little interest or pleasure in doing things?:no   Gynecologic History No LMP recorded. Contraception: none Last Pap: 2023. Results were: normal Last mammogram: n/a. Results were: n/a  Obstetric History OB History  Gravida Para Term Preterm AB Living  5 2 2  0 3 2  SAB IAB Ectopic Multiple Live Births  0 3 0 0 2    # Outcome Date GA Lbr Len/2nd Weight Sex Delivery Anes PTL Lv  5 Term 07/25/16 [redacted]w[redacted]d 12:23 / 00:17 7 lb 12.2 oz (3.52 kg) F Vag-Spont EPI  LIV  4 Term 07/20/11 [redacted]w[redacted]d 375:02 / 00:27  F Vag-Spont   LIV  3 IAB           2 IAB           1 IAB             Past Medical History:  Diagnosis Date   Chlamydia    Gallstones 06/19/2018   Hypertension    UTI (urinary tract infection)     Past Surgical History:  Procedure Laterality Date   CHOLECYSTECTOMY N/A 06/19/2018   Procedure: LAPAROSCOPIC CHOLECYSTECTOMY WITH INTRAOPERATIVE CHOLANGIOGRAM ERAS PATHWAY;  Surgeon: Claud Kelp, MD;  Location: [facility] OR;  Service: General;  Laterality: N/A;   ESOPHAGOGASTRODUODENOSCOPY N/A 06/11/2018   Procedure:  ESOPHAGOGASTRODUODENOSCOPY (EGD);  Surgeon: Rachael Fee, MD;  Location: [facility];  Service: Endoscopy;  Laterality: N/A;   EUS N/A 06/11/2018   Procedure: UPPER ENDOSCOPIC ULTRASOUND (EUS) RADIAL;  Surgeon: Rachael Fee, MD;  Location: [facility];  Service: Endoscopy;  Laterality: N/A;   EYE SURGERY     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    FINE NEEDLE ASPIRATION N/A 06/11/2018   Procedure: FINE NEEDLE ASPIRATION (FNA) LINEAR;  Surgeon: Rachael Fee, MD;  Location: [facility]COPY;  Service: Endoscopy;  Laterality: N/A;   INDUCED ABORTION       Current Outpatient Medications:    amLODipine (NORVASC) 5 MG tablet, Take by mouth., Disp: , Rfl:    bacitracin ointment, Apply 1 application topically 2 (two) times daily., Disp: 120 g, Rfl: 0   ondansetron (ZOFRAN ODT) 8 MG disintegrating tablet, Take 1 tablet (8 mg total) by mouth every 8 (eight) hours as needed for nausea or vomiting., Disp: 10 tablet, Rfl: 0   clotrimazole (LOTRIMIN) 1 % cream, APPLY TO AFFECTED AREA TWICE A DAY (Patient not taking: Reported on 11/20/2021), Disp: 60 g, Rfl: 2   fluconazole (DIFLUCAN) 200 MG tablet, Take 1 tablet (200 mg total) by mouth every 3 (three) days. (Patient not taking: Reported on 01/02/2023), Disp: 3 tablet, Rfl:  2   fluticasone (FLONASE) 50 MCG/ACT nasal spray, Place 2 sprays into both nostrils daily. (Patient not taking: Reported on 01/02/2023), Disp: 9.9 g, Rfl: 0   metroNIDAZOLE (FLAGYL) 500 MG tablet, Take 1 tablet (500 mg total) by mouth 2 (two) times daily. (Patient not taking: Reported on 01/02/2023), Disp: 14 tablet, Rfl: 2   oxyCODONE-acetaminophen (PERCOCET) 5-325 MG tablet, Take 1 tablet by mouth every 6 (six) hours as needed for severe pain. (Patient not taking: Reported on 01/02/2023), Disp: 12 tablet, Rfl: 0 Allergies  Allergen Reactions   Bactrim [Sulfamethoxazole-Trimethoprim] Hives    Social History   Tobacco Use   Smoking status: Never   Smokeless tobacco: Never  Substance Use  Topics   Alcohol use: Yes    Comment: occasionally    Family History  Problem Relation Age of Onset   Hypertension Mother    Asthma Mother    Alcohol abuse Mother    Hypertension Father    Hyperlipidemia Father    Breast cancer Maternal Grandmother        5s   Cancer Paternal Grandmother    Anesthesia problems Paternal Grandmother       Review of Systems  Constitutional: negative for fatigue and weight loss Respiratory: negative for cough and wheezing Cardiovascular: negative for chest pain, fatigue and palpitations Gastrointestinal: negative for abdominal pain and change in bowel habits Musculoskeletal:negative for myalgias Neurological: negative for gait problems and tremors Behavioral/Psych: negative for abusive relationship, depression Endocrine: negative for temperature intolerance    Genitourinary:negative for abnormal menstrual periods, genital lesions, hot flashes, sexual problems and vaginal discharge Integument/breast: negative for breast lump, breast tenderness, nipple discharge and skin lesion(s)    Objective:       BP 126/88   Pulse 60   Ht 5\' 2"  (1.575 m)   Wt 173 lb 9.6 oz (78.7 kg)   BMI 31.75 kg/m  General:   alert  Skin:   no rash or abnormalities  Lungs:   clear to auscultation bilaterally  Heart:   regular rate and rhythm, S1, S2 normal, no murmur, click, rub or gallop  Breasts:   normal without suspicious masses, skin or nipple changes or axillary nodes  Abdomen:  normal findings: no organomegaly, soft, non-tender and no hernia  Pelvis:  External genitalia: normal general appearance Urinary system: urethral meatus normal and bladder without fullness, nontender Vaginal: normal without tenderness, induration or masses Cervix: normal appearance Adnexa: normal bimanual exam Uterus: anteverted and non-tender, normal size   Lab Review Urine pregnancy test Labs reviewed yes Radiologic studies reviewed yes  I have spent a total of 20 minutes of  face-to-face time, excluding clinical staff time, reviewing notes and preparing to see patient, ordering tests and/or medications, and counseling the patient.   Assessment:   1. Encounter for gynecological examination with Papanicolaou smear of cervix Rx: - Cytology - PAP( [place])  2. Vaginal discharge Rx: - Cervicovaginal ancillary only( [place])  3. Screening for STDs (sexually transmitted diseases) Rx: - HIV antibody (with reflex) - Hepatitis C Antibody - Hepatitis B Surface AntiGEN - RPR     Plan:    Education reviewed: calcium supplements, depression evaluation, low fat, low cholesterol diet, safe sex/STD prevention, self breast exams, and weight bearing exercise. Contraception: condoms. Follow up in: 1 year.   No orders of the defined types were placed in this encounter.  Orders Placed This Encounter  Procedures   HIV antibody (with reflex)   Hepatitis C Antibody   Hepatitis  B Surface AntiGEN   RPR    Brock Bad, MD 01/02/2023 1:25 PM

## 2023-01-03 ENCOUNTER — Ambulatory Visit: Payer: BC Managed Care – PPO | Admitting: Obstetrics

## 2023-01-03 LAB — CERVICOVAGINAL ANCILLARY ONLY
Bacterial Vaginitis (gardnerella): POSITIVE — AB
Candida Glabrata: NEGATIVE
Candida Vaginitis: POSITIVE — AB
Chlamydia: POSITIVE — AB
Comment: NEGATIVE
Comment: NEGATIVE
Comment: NEGATIVE
Comment: NEGATIVE
Comment: NEGATIVE
Comment: NORMAL
Neisseria Gonorrhea: NEGATIVE
Trichomonas: NEGATIVE

## 2023-01-03 LAB — RPR: RPR Ser Ql: NONREACTIVE

## 2023-01-03 LAB — HIV ANTIBODY (ROUTINE TESTING W REFLEX): HIV Screen 4th Generation wRfx: NONREACTIVE

## 2023-01-03 LAB — HEPATITIS C ANTIBODY: Hep C Virus Ab: NONREACTIVE

## 2023-01-03 LAB — HEPATITIS B SURFACE ANTIGEN: Hepatitis B Surface Ag: NEGATIVE

## 2023-01-06 ENCOUNTER — Other Ambulatory Visit: Payer: Self-pay | Admitting: Emergency Medicine

## 2023-01-06 MED ORDER — FLUCONAZOLE 150 MG PO TABS: 150.0000 mg | ORAL_TABLET | Freq: Once | ORAL | 0 refills | Status: AC

## 2023-01-06 MED ORDER — DOXYCYCLINE HYCLATE 100 MG PO CAPS: 100.0000 mg | ORAL_CAPSULE | Freq: Two times a day (BID) | ORAL | 0 refills | Status: AC

## 2023-01-06 MED ORDER — METRONIDAZOLE 0.75 % VA GEL: 1.0000 | Freq: Every day | VAGINAL | 1 refills | Status: AC

## 2023-01-06 NOTE — Progress Notes (Signed)
Rx for yeast, chlamydia and BV sent to pharmacy. Pt informed.

## 2023-01-08 LAB — CYTOLOGY - PAP
Comment: NEGATIVE
Comment: NEGATIVE
Comment: NEGATIVE
Diagnosis: UNDETERMINED — AB
HPV 16: NEGATIVE
HPV 18 / 45: NEGATIVE
High risk HPV: POSITIVE — AB

## 2023-01-14 ENCOUNTER — Ambulatory Visit
Admission: RE | Admit: 2023-01-14 | Discharge: 2023-01-14 | Disposition: A | Discharge status: Home / Self Care | Payer: BC Managed Care – PPO | Source: Ambulatory Visit | Attending: Family Medicine | Admitting: Family Medicine

## 2023-01-14 ENCOUNTER — Other Ambulatory Visit: Payer: Self-pay | Admitting: Family Medicine

## 2023-01-14 DIAGNOSIS — R921 Mammographic calcification found on diagnostic imaging of breast: Secondary | ICD-10-CM

## 2023-02-17 ENCOUNTER — Other Ambulatory Visit (HOSPITAL_COMMUNITY)
Admission: RE | Admit: 2023-02-17 | Discharge: 2023-02-17 | Disposition: A | Discharge status: Home / Self Care | Payer: 59 | Source: Ambulatory Visit | Attending: Obstetrics & Gynecology | Admitting: Obstetrics & Gynecology

## 2023-02-17 ENCOUNTER — Ambulatory Visit (INDEPENDENT_AMBULATORY_CARE_PROVIDER_SITE_OTHER): Payer: 59 | Admitting: Obstetrics & Gynecology

## 2023-02-17 DIAGNOSIS — R87610 Atypical squamous cells of undetermined significance on cytologic smear of cervix (ASC-US): Secondary | ICD-10-CM | POA: Diagnosis present

## 2023-02-17 DIAGNOSIS — R87810 Cervical high risk human papillomavirus (HPV) DNA test positive: Secondary | ICD-10-CM | POA: Diagnosis not present

## 2023-02-17 DIAGNOSIS — Z3202 Encounter for pregnancy test, result negative: Secondary | ICD-10-CM

## 2023-02-17 LAB — POCT URINE PREGNANCY: Preg Test, Ur: NEGATIVE

## 2023-02-17 NOTE — Progress Notes (Signed)
Patient ID: Julia Mayer, female   DOB: 1990/07/19, 32 y.o.   MRN: [id number]  Chief Complaint  Patient presents with   Colposcopy    HPI Julia Mayer is a 32 y.o. female.  [id number] No LMP recorded.  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 Previous colposcopy: no. Prior cervical treatment: no treatment.  Past Medical History:  Diagnosis Date   Chlamydia    Gallstones 06/19/2018   Hypertension    UTI (urinary tract infection)     Past Surgical History:  Procedure Laterality Date   CHOLECYSTECTOMY N/A 06/19/2018   Procedure: LAPAROSCOPIC CHOLECYSTECTOMY WITH INTRAOPERATIVE CHOLANGIOGRAM ERAS PATHWAY;  Surgeon: Claud Kelp, MD;  Location: 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    FINE NEEDLE ASPIRATION N/A 06/11/2018   Procedure: FINE NEEDLE ASPIRATION (FNA) LINEAR;  Surgeon: Rachael Fee, MD;  Location: [facility];  Service: Endoscopy;  Laterality: N/A;   INDUCED ABORTION      Family History  Problem Relation Age of Onset   Hypertension Mother    Asthma Mother    Alcohol abuse Mother    Hypertension Father    Hyperlipidemia Father    Breast cancer Maternal Grandmother        67s   Cancer Paternal Grandmother    Anesthesia problems Paternal Grandmother     Social History Social History   Tobacco Use   Smoking status: Never   Smokeless tobacco: Never  Vaping Use   Vaping status: Never Used  Substance Use Topics   Alcohol use: Yes    Comment: occasionally   Drug use: No    Allergies  Allergen Reactions    Bactrim [Sulfamethoxazole-Trimethoprim] Hives    Current Outpatient Medications  Medication Sig Dispense Refill   amLODipine (NORVASC) 5 MG tablet Take by mouth.     bacitracin ointment Apply 1 application topically 2 (two) times daily. 120 g 0   clotrimazole (LOTRIMIN) 1 % cream APPLY TO AFFECTED AREA TWICE A DAY (Patient not taking: Reported on 11/20/2021) 60 g 2   doxycycline (VIBRAMYCIN) 100 MG capsule Take 1 capsule (100 mg total) by mouth 2 (two) times daily. 14 capsule 0   fluconazole (DIFLUCAN) 200 MG tablet Take 1 tablet (200 mg total) by mouth every 3 (three) days. (Patient not taking: Reported on 01/02/2023) 3 tablet 2   fluticasone (FLONASE) 50 MCG/ACT nasal spray Place 2 sprays into both nostrils daily. (Patient not taking: Reported on 01/02/2023) 9.9 g 0   metroNIDAZOLE (FLAGYL) 500 MG tablet Take 1 tablet (500 mg total) by mouth 2 (two) times daily. (Patient not taking: Reported on 01/02/2023) 14 tablet 2   metroNIDAZOLE (METROGEL) 0.75 % vaginal gel Place 1 Applicatorful vaginally at bedtime. Apply one applicatorful to vagina at bedtime for 5 days 70 g 1   ondansetron (ZOFRAN ODT) 8 MG disintegrating tablet Take 1 tablet (8 mg total) by mouth every 8 (eight) hours as needed for nausea or vomiting. 10 tablet 0   oxyCODONE-acetaminophen (PERCOCET) 5-325 MG tablet Take 1 tablet by mouth every 6 (  six) hours as needed for severe pain. (Patient not taking: Reported on 01/02/2023) 12 tablet 0   No current facility-administered medications for this visit.    Review of Systems Review of Systems  Constitutional: Negative.     There were no vitals taken for this visit.  Physical Exam Physical Exam Vitals and nursing note reviewed. Exam conducted with a chaperone present.  Constitutional:      Appearance: Normal appearance.  Genitourinary:    General: Normal vulva.     Exam position: Lithotomy position.     Vagina: Normal.     Cervix: Normal.  Neurological:     Mental Status: She  is alert.  Psychiatric:        Mood and Affect: Mood normal.        Behavior: Behavior normal.   Patient given informed consent, signed copy in the chart, time out was performed.  Placed in lithotomy position. Cervix viewed with speculum and colposcope after application of acetic acid.   Colposcopy adequate?  yes Acetowhite lesions?no Punctation?no Mosaicism?  no Abnormal vasculature?  no Biopsies?at 7 ECC?yes C/w HPV effect COMMENTS: Patient was given post procedure instructions.  She will return in 2 weeks for results.    Data Reviewed Pap results  Assessment    Procedure Details  The risks and benefits of the procedure and Written informed consent obtained.   Specimens: ECC and Bx at 7  Complications: none.     Plan    Specimens labelled and sent to Pathology.       Scheryl Darter 02/17/2023, 4:48 PM

## 2023-03-06 ENCOUNTER — Encounter: Payer: Self-pay | Admitting: Emergency Medicine

## 2024-01-05 ENCOUNTER — Other Ambulatory Visit (HOSPITAL_COMMUNITY)
Admission: RE | Admit: 2024-01-05 | Discharge: 2024-01-05 | Disposition: A | Discharge status: Home / Self Care | Source: Ambulatory Visit | Attending: Obstetrics | Admitting: Obstetrics

## 2024-01-05 ENCOUNTER — Ambulatory Visit: Admitting: Obstetrics

## 2024-01-05 ENCOUNTER — Encounter: Payer: Self-pay | Admitting: Obstetrics

## 2024-01-05 VITALS — BP 148/101 | HR 53 | Ht 62.0 in | Wt 185.2 lb

## 2024-01-05 DIAGNOSIS — Z01419 Encounter for gynecological examination (general) (routine) without abnormal findings: Secondary | ICD-10-CM | POA: Diagnosis present

## 2024-01-05 DIAGNOSIS — Z3009 Encounter for other general counseling and advice on contraception: Secondary | ICD-10-CM

## 2024-01-05 DIAGNOSIS — Z113 Encounter for screening for infections with a predominantly sexual mode of transmission: Secondary | ICD-10-CM

## 2024-01-05 DIAGNOSIS — N898 Other specified noninflammatory disorders of vagina: Secondary | ICD-10-CM | POA: Insufficient documentation

## 2024-01-05 DIAGNOSIS — E669 Obesity, unspecified: Secondary | ICD-10-CM | POA: Diagnosis not present

## 2024-01-05 NOTE — Progress Notes (Signed)
 Subjective:        Julia Mayer is a 33 y.o. female here for a routine exam.  Current complaints: Vaginal discharge.    Personal health questionnaire:  Is patient Ashkenazi Jewish, have a family history of breast and/or ovarian cancer: yes Is there a family history of uterine cancer diagnosed at age < 18, gastrointestinal cancer, urinary tract cancer, family member who is a Personnel officer syndrome-associated carrier: no Is the patient overweight and hypertensive, family history of diabetes, personal history of gestational diabetes, preeclampsia or PCOS: yes Is patient over 53, have PCOS,  family history of premature CHD under age 46, diabetes, smoke, have hypertension or peripheral artery disease:  no At any time, has a partner hit, kicked or otherwise hurt or frightened you?: no Over the past 2 weeks, have you felt down, depressed or hopeless?: no Over the past 2 weeks, have you felt little interest or pleasure in doing things?:no   Gynecologic History Patient's last menstrual period was 12/14/2023 (exact date). Contraception: none Last Pap: 01/02/2023. Results were: normal Last mammogram: 01-10-2023. Results were: normal  Obstetric History OB History  Gravida Para Term Preterm AB Living  5 2 2  0 3 2  SAB IAB Ectopic Multiple Live Births  0 3 0 0 2    # Outcome Date GA Lbr Len/2nd Weight Sex Type Anes PTL Lv  5 Term 07/25/16 [redacted]w[redacted]d 12:23 / 00:17 7 lb 12.2 oz (3.52 kg) F Vag-Spont EPI  LIV  4 Term 07/20/11 [redacted]w[redacted]d 375:02 / 00:27  F Vag-Spont   LIV  3 IAB           2 IAB           1 IAB             Past Medical History:  Diagnosis Date   Chlamydia    Gallstones 06/19/2018   Hypertension    UTI (urinary tract infection)     Past Surgical History:  Procedure Laterality Date   CHOLECYSTECTOMY N/A 06/19/2018   Procedure: LAPAROSCOPIC CHOLECYSTECTOMY WITH INTRAOPERATIVE CHOLANGIOGRAM ERAS PATHWAY;  Surgeon: Gail Favorite, MD;  Location: [facility] OR;  Service: General;  Laterality: N/A;    ESOPHAGOGASTRODUODENOSCOPY N/A 06/11/2018   Procedure: ESOPHAGOGASTRODUODENOSCOPY (EGD);  Surgeon: Teressa Toribio SQUIBB, MD;  Location: [facility];  Service: Endoscopy;  Laterality: N/A;   EUS N/A 06/11/2018   Procedure: UPPER ENDOSCOPIC ULTRASOUND (EUS) RADIAL;  Surgeon: Teressa Toribio SQUIBB, MD;  Location: [facility];  Service: Endoscopy;  Laterality: N/A;   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    FINE NEEDLE ASPIRATION N/A 06/11/2018   Procedure: FINE NEEDLE ASPIRATION (FNA) LINEAR;  Surgeon: Teressa Toribio SQUIBB, MD;  Location: [facility];  Service: Endoscopy;  Laterality: N/A;   INDUCED ABORTION       Current Outpatient Medications:    amLODipine (NORVASC) 5 MG tablet, Take by mouth., Disp: , Rfl:    bacitracin  ointment, Apply 1 application topically 2 (two) times daily. (Patient not taking: Reported on 01/05/2024), Disp: 120 g, Rfl: 0   clotrimazole  (LOTRIMIN ) 1 % cream, APPLY TO AFFECTED AREA TWICE A DAY (Patient not taking: Reported on 01/05/2024), Disp: 60 g, Rfl: 2   doxycycline  (VIBRAMYCIN ) 100 MG capsule, Take 1 capsule (100 mg total) by mouth 2 (two) times daily. (Patient not taking: Reported on 01/05/2024), Disp: 14 capsule, Rfl: 0   fluconazole  (DIFLUCAN ) 200 MG tablet, Take 1 tablet (200 mg total) by mouth every 3 (three) days. (Patient  not taking: Reported on 01/05/2024), Disp: 3 tablet, Rfl: 2   fluticasone  (FLONASE ) 50 MCG/ACT nasal spray, Place 2 sprays into both nostrils daily. (Patient not taking: Reported on 01/05/2024), Disp: 9.9 g, Rfl: 0   metroNIDAZOLE  (FLAGYL ) 500 MG tablet, Take 1 tablet (500 mg total) by mouth 2 (two) times daily. (Patient not taking: Reported on 01/05/2024), Disp: 14 tablet, Rfl: 2   metroNIDAZOLE  (METROGEL ) 0.75 % vaginal gel, Place 1 Applicatorful vaginally at bedtime. Apply one applicatorful to vagina at bedtime for 5 days (Patient not taking: Reported on 01/05/2024), Disp: 70 g, Rfl: 1   ondansetron  (ZOFRAN  ODT) 8 MG disintegrating tablet, Take 1 tablet (8  mg total) by mouth every 8 (eight) hours as needed for nausea or vomiting. (Patient not taking: Reported on 01/05/2024), Disp: 10 tablet, Rfl: 0   oxyCODONE -acetaminophen  (PERCOCET) 5-325 MG tablet, Take 1 tablet by mouth every 6 (six) hours as needed for severe pain. (Patient not taking: Reported on 01/05/2024), Disp: 12 tablet, Rfl: 0 Allergies  Allergen Reactions   Bactrim  [Sulfamethoxazole -Trimethoprim ] Hives    Social History   Tobacco Use   Smoking status: Never   Smokeless tobacco: Never  Substance Use Topics   Alcohol use: Yes    Comment: occasionally    Family History  Problem Relation Age of Onset   Hypertension Mother    Asthma Mother    Alcohol abuse Mother    Hypertension Father    Hyperlipidemia Father    Breast cancer Maternal Grandmother        46s   Cancer Paternal Grandmother    Anesthesia problems Paternal Grandmother       Review of Systems  Constitutional: negative for fatigue and weight loss Respiratory: negative for cough and wheezing Cardiovascular: negative for chest pain, fatigue and palpitations Gastrointestinal: negative for abdominal pain and change in bowel habits Musculoskeletal:negative for myalgias Neurological: negative for gait problems and tremors Behavioral/Psych: negative for abusive relationship, depression Endocrine: negative for temperature intolerance    Genitourinary: positive for vaginal discharge.  negative for abnormal menstrual periods, genital lesions, hot flashes, sexual problems  Integument/breast: negative for breast lump, breast tenderness, nipple discharge and skin lesion(s)    Objective:       BP (!) 148/101 Comment: Have not taken BP meds today  Pulse (!) 53   Ht 5' 2 (1.575 m)   Wt 185 lb 3.2 oz (84 kg)   LMP 12/14/2023 (Exact Date)   BMI 33.87 kg/m  General:   Alert and no distress  Skin:   no rash or abnormalities  Lungs:   clear to auscultation bilaterally  Heart:   regular rate and rhythm, S1, S2  normal, no murmur, click, rub or gallop  Breasts:   normal without suspicious masses, skin or nipple changes or axillary nodes  Abdomen:  normal findings: no organomegaly, soft, non-tender and no hernia  Pelvis:  External genitalia: normal general appearance Urinary system: urethral meatus normal and bladder without fullness, nontender Vaginal: normal without tenderness, induration or masses Cervix: normal appearance Adnexa: normal bimanual exam Uterus: anteverted and non-tender, normal size   Lab Review Urine pregnancy test Labs reviewed yes Radiologic studies reviewed yes  I have spent a total of 20 minutes of face-to-face time, excluding clinical staff time, reviewing notes and preparing to see patient, ordering tests and/or medications, and counseling the patient.   Assessment:    1. Encounter for gynecological examination with Papanicolaou smear of cervix (Primary) Rx: - Cytology - PAP  2. Vaginal  discharge Rx: - Cervicovaginal ancillary only  3. Screening for STDs (sexually transmitted diseases) Rx: - Hepatitis B surface antigen - Hepatitis C antibody - HIV Antibody (routine testing w rflx) - RPR  4. Encounter for other general counseling or advice on contraception - options discussed - declines contraception.  Condoms recommended for STD prevention.  5. Obesity (BMI 30-39.9) - weight reduction with the aid of dietary changes, exercise and behavioral modification recommended     Plan:    Education reviewed: calcium supplements, depression evaluation, low fat, low cholesterol diet, safe sex/STD prevention, self breast exams, and weight bearing exercise. Contraception: none. Follow up in: 1 year.    Orders Placed This Encounter  Procedures   Hepatitis B surface antigen   Hepatitis C antibody   HIV Antibody (routine testing w rflx)   RPR    CARLIN RONAL CENTERS, MD, FACOG Attending Obstetrician & Gynecologist, [facility],  [facility], [place] 01/05/2024

## 2024-01-05 NOTE — Progress Notes (Signed)
 Pt presents for annual exam. Request all STI testing. No other questions or concerns.

## 2024-01-06 LAB — CERVICOVAGINAL ANCILLARY ONLY
Bacterial Vaginitis (gardnerella): NEGATIVE
Candida Glabrata: NEGATIVE
Candida Vaginitis: NEGATIVE
Chlamydia: NEGATIVE
Comment: NEGATIVE
Comment: NEGATIVE
Comment: NEGATIVE
Comment: NEGATIVE
Comment: NEGATIVE
Comment: NORMAL
Neisseria Gonorrhea: NEGATIVE
Trichomonas: NEGATIVE

## 2024-01-06 LAB — RPR: RPR Ser Ql: NONREACTIVE

## 2024-01-06 LAB — HIV ANTIBODY (ROUTINE TESTING W REFLEX): HIV Screen 4th Generation wRfx: NONREACTIVE

## 2024-01-06 LAB — HEPATITIS C ANTIBODY: Hep C Virus Ab: NONREACTIVE

## 2024-01-06 LAB — HEPATITIS B SURFACE ANTIGEN: Hepatitis B Surface Ag: NEGATIVE

## 2024-01-08 ENCOUNTER — Ambulatory Visit: Payer: Self-pay | Admitting: Family Medicine

## 2024-01-08 LAB — CYTOLOGY - PAP
Comment: NEGATIVE
Diagnosis: NEGATIVE
High risk HPV: NEGATIVE
